# Patient Record
Sex: Male | Born: 1960 | Race: White | Hispanic: No | Marital: Married | Smoking: Former smoker
Health system: Southern US, Community
[De-identification: ages and names within clinical notes are randomized; demographics above are authoritative.]

## PROBLEM LIST (undated history)

## (undated) DIAGNOSIS — E669 Obesity, unspecified: Secondary | ICD-10-CM

## (undated) DIAGNOSIS — Z86010 Personal history of colonic polyps: Secondary | ICD-10-CM

## (undated) DIAGNOSIS — K635 Polyp of colon: Secondary | ICD-10-CM

## (undated) DIAGNOSIS — I10 Essential (primary) hypertension: Secondary | ICD-10-CM

## (undated) DIAGNOSIS — E119 Type 2 diabetes mellitus without complications: Secondary | ICD-10-CM

## (undated) DIAGNOSIS — I639 Cerebral infarction, unspecified: Secondary | ICD-10-CM

## (undated) DIAGNOSIS — K649 Unspecified hemorrhoids: Secondary | ICD-10-CM

## (undated) DIAGNOSIS — I6529 Occlusion and stenosis of unspecified carotid artery: Secondary | ICD-10-CM

## (undated) DIAGNOSIS — I739 Peripheral vascular disease, unspecified: Secondary | ICD-10-CM

## (undated) HISTORY — DX: Obesity, unspecified: E66.9

## (undated) HISTORY — PX: OTHER SURGICAL HISTORY: SHX169

## (undated) HISTORY — DX: Cerebral infarction, unspecified: I63.9

## (undated) HISTORY — PX: BACK SURGERY: SHX140

## (undated) HISTORY — PX: CHOLECYSTECTOMY: SHX55

## (undated) HISTORY — PX: SPINAL FUSION: SHX223

---
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
## 1980-03-17 HISTORY — PX: NECK SURGERY: SHX720

## 1998-09-03 ENCOUNTER — Ambulatory Visit (HOSPITAL_COMMUNITY): Admission: RE | Admit: 1998-09-03 | Discharge: 1998-09-03 | Payer: Self-pay | Admitting: Family Medicine

## 1998-09-04 ENCOUNTER — Ambulatory Visit (HOSPITAL_COMMUNITY): Admission: RE | Admit: 1998-09-04 | Discharge: 1998-09-04 | Payer: Self-pay | Admitting: Family Medicine

## 1998-09-04 ENCOUNTER — Encounter: Payer: Self-pay | Admitting: Family Medicine

## 1999-03-22 ENCOUNTER — Encounter (INDEPENDENT_AMBULATORY_CARE_PROVIDER_SITE_OTHER): Payer: Self-pay | Admitting: *Deleted

## 1999-03-22 ENCOUNTER — Ambulatory Visit (HOSPITAL_BASED_OUTPATIENT_CLINIC_OR_DEPARTMENT_OTHER): Admission: RE | Admit: 1999-03-22 | Discharge: 1999-03-22 | Payer: Self-pay | Admitting: Urology

## 2000-07-17 ENCOUNTER — Encounter: Payer: Self-pay | Admitting: Family Medicine

## 2000-07-17 ENCOUNTER — Ambulatory Visit (HOSPITAL_COMMUNITY): Admission: RE | Admit: 2000-07-17 | Discharge: 2000-07-17 | Payer: Self-pay | Admitting: Family Medicine

## 2000-12-03 ENCOUNTER — Encounter: Admission: RE | Admit: 2000-12-03 | Discharge: 2000-12-03 | Payer: Self-pay | Admitting: Orthopaedic Surgery

## 2000-12-03 ENCOUNTER — Encounter: Payer: Self-pay | Admitting: Orthopaedic Surgery

## 2001-01-13 ENCOUNTER — Encounter: Payer: Self-pay | Admitting: Orthopaedic Surgery

## 2001-01-20 ENCOUNTER — Inpatient Hospital Stay (HOSPITAL_COMMUNITY): Admission: RE | Admit: 2001-01-20 | Discharge: 2001-01-24 | Payer: Self-pay | Admitting: Orthopaedic Surgery

## 2001-01-20 ENCOUNTER — Encounter: Payer: Self-pay | Admitting: Orthopaedic Surgery

## 2001-01-23 ENCOUNTER — Encounter: Payer: Self-pay | Admitting: Orthopaedic Surgery

## 2001-06-21 ENCOUNTER — Ambulatory Visit (HOSPITAL_COMMUNITY): Admission: RE | Admit: 2001-06-21 | Discharge: 2001-06-21 | Payer: Self-pay | Admitting: Orthopaedic Surgery

## 2001-06-21 ENCOUNTER — Encounter: Payer: Self-pay | Admitting: Orthopaedic Surgery

## 2001-07-19 ENCOUNTER — Encounter: Admission: RE | Admit: 2001-07-19 | Discharge: 2001-07-19 | Payer: Self-pay | Admitting: Orthopaedic Surgery

## 2001-07-19 ENCOUNTER — Encounter: Payer: Self-pay | Admitting: Orthopaedic Surgery

## 2002-05-11 ENCOUNTER — Ambulatory Visit (HOSPITAL_COMMUNITY): Admission: RE | Admit: 2002-05-11 | Discharge: 2002-05-11 | Payer: Self-pay | Admitting: Family Medicine

## 2002-05-11 ENCOUNTER — Encounter: Payer: Self-pay | Admitting: Family Medicine

## 2002-06-17 ENCOUNTER — Encounter: Payer: Self-pay | Admitting: Vascular Surgery

## 2002-06-21 ENCOUNTER — Ambulatory Visit (HOSPITAL_COMMUNITY): Admission: RE | Admit: 2002-06-21 | Discharge: 2002-06-21 | Payer: Self-pay | Admitting: Vascular Surgery

## 2003-07-13 ENCOUNTER — Ambulatory Visit (HOSPITAL_COMMUNITY): Admission: RE | Admit: 2003-07-13 | Discharge: 2003-07-13 | Payer: Self-pay | Admitting: Family Medicine

## 2004-08-02 ENCOUNTER — Ambulatory Visit: Payer: Self-pay | Admitting: Cardiology

## 2004-08-05 ENCOUNTER — Ambulatory Visit: Payer: Self-pay | Admitting: Cardiology

## 2004-08-05 ENCOUNTER — Ambulatory Visit (HOSPITAL_COMMUNITY): Admission: RE | Admit: 2004-08-05 | Discharge: 2004-08-05 | Payer: Self-pay | Admitting: Cardiology

## 2004-08-08 ENCOUNTER — Ambulatory Visit: Payer: Self-pay | Admitting: Cardiology

## 2004-08-08 ENCOUNTER — Inpatient Hospital Stay (HOSPITAL_BASED_OUTPATIENT_CLINIC_OR_DEPARTMENT_OTHER): Admission: RE | Admit: 2004-08-08 | Discharge: 2004-08-08 | Payer: Self-pay | Admitting: Cardiology

## 2004-08-30 ENCOUNTER — Ambulatory Visit: Payer: Self-pay | Admitting: Cardiology

## 2007-05-20 ENCOUNTER — Inpatient Hospital Stay (HOSPITAL_COMMUNITY): Admission: AD | Admit: 2007-05-20 | Discharge: 2007-05-23 | Payer: Self-pay | Admitting: General Surgery

## 2007-05-21 ENCOUNTER — Encounter (INDEPENDENT_AMBULATORY_CARE_PROVIDER_SITE_OTHER): Payer: Self-pay | Admitting: General Surgery

## 2008-01-15 ENCOUNTER — Emergency Department (HOSPITAL_COMMUNITY): Admission: EM | Admit: 2008-01-15 | Discharge: 2008-01-15 | Payer: Self-pay | Admitting: Emergency Medicine

## 2008-12-04 DIAGNOSIS — I10 Essential (primary) hypertension: Secondary | ICD-10-CM | POA: Insufficient documentation

## 2008-12-04 DIAGNOSIS — K279 Peptic ulcer, site unspecified, unspecified as acute or chronic, without hemorrhage or perforation: Secondary | ICD-10-CM | POA: Insufficient documentation

## 2008-12-04 DIAGNOSIS — I7090 Unspecified atherosclerosis: Secondary | ICD-10-CM | POA: Insufficient documentation

## 2008-12-06 ENCOUNTER — Ambulatory Visit: Payer: Self-pay | Admitting: Cardiology

## 2008-12-06 DIAGNOSIS — R079 Chest pain, unspecified: Secondary | ICD-10-CM

## 2008-12-06 DIAGNOSIS — R0989 Other specified symptoms and signs involving the circulatory and respiratory systems: Secondary | ICD-10-CM

## 2008-12-06 DIAGNOSIS — R0602 Shortness of breath: Secondary | ICD-10-CM

## 2008-12-06 DIAGNOSIS — R42 Dizziness and giddiness: Secondary | ICD-10-CM

## 2008-12-19 ENCOUNTER — Telehealth (INDEPENDENT_AMBULATORY_CARE_PROVIDER_SITE_OTHER): Payer: Self-pay | Admitting: *Deleted

## 2008-12-20 ENCOUNTER — Ambulatory Visit: Payer: Self-pay | Admitting: Cardiology

## 2008-12-20 ENCOUNTER — Ambulatory Visit: Payer: Self-pay

## 2008-12-20 ENCOUNTER — Encounter (HOSPITAL_COMMUNITY): Admission: RE | Admit: 2008-12-20 | Discharge: 2009-02-16 | Payer: Self-pay | Admitting: Cardiology

## 2008-12-20 ENCOUNTER — Encounter: Payer: Self-pay | Admitting: Cardiology

## 2008-12-28 ENCOUNTER — Telehealth: Payer: Self-pay | Admitting: Cardiology

## 2009-01-11 ENCOUNTER — Encounter: Payer: Self-pay | Admitting: Cardiology

## 2009-01-12 ENCOUNTER — Ambulatory Visit: Payer: Self-pay | Admitting: Cardiology

## 2009-01-12 DIAGNOSIS — F17200 Nicotine dependence, unspecified, uncomplicated: Secondary | ICD-10-CM

## 2009-07-26 ENCOUNTER — Encounter: Payer: Self-pay | Admitting: Cardiology

## 2009-08-01 ENCOUNTER — Ambulatory Visit: Payer: Self-pay

## 2009-08-01 ENCOUNTER — Encounter: Payer: Self-pay | Admitting: Cardiology

## 2009-09-04 ENCOUNTER — Encounter: Payer: Self-pay | Admitting: Cardiology

## 2009-09-06 ENCOUNTER — Ambulatory Visit: Payer: Self-pay | Admitting: Cardiology

## 2009-09-06 DIAGNOSIS — I6529 Occlusion and stenosis of unspecified carotid artery: Secondary | ICD-10-CM

## 2009-09-07 LAB — CONVERTED CEMR LAB
BUN: 22 mg/dL (ref 6–23)
Basophils Absolute: 0.1 10*3/uL (ref 0.0–0.1)
CO2: 26 meq/L (ref 19–32)
Chloride: 108 meq/L (ref 96–112)
Eosinophils Absolute: 0.2 10*3/uL (ref 0.0–0.7)
Glucose, Bld: 105 mg/dL — ABNORMAL HIGH (ref 70–99)
HCT: 34 % — ABNORMAL LOW (ref 39.0–52.0)
INR: 0.9 (ref 0.8–1.0)
MCHC: 34.3 g/dL (ref 30.0–36.0)
MCV: 87.3 fL (ref 78.0–100.0)
Monocytes Relative: 6.5 % (ref 3.0–12.0)
Platelets: 333 10*3/uL (ref 150.0–400.0)
Potassium: 4.8 meq/L (ref 3.5–5.1)
aPTT: 34.2 s — ABNORMAL HIGH (ref 21.7–28.8)

## 2009-09-11 ENCOUNTER — Ambulatory Visit (HOSPITAL_COMMUNITY): Admission: RE | Admit: 2009-09-11 | Discharge: 2009-09-11 | Payer: Self-pay | Admitting: Cardiology

## 2009-09-11 ENCOUNTER — Ambulatory Visit: Payer: Self-pay | Admitting: Cardiology

## 2009-10-19 ENCOUNTER — Ambulatory Visit: Payer: Self-pay | Admitting: Vascular Surgery

## 2009-12-13 ENCOUNTER — Telehealth (INDEPENDENT_AMBULATORY_CARE_PROVIDER_SITE_OTHER): Payer: Self-pay | Admitting: *Deleted

## 2009-12-27 ENCOUNTER — Ambulatory Visit: Payer: Self-pay | Admitting: Vascular Surgery

## 2010-02-12 ENCOUNTER — Ambulatory Visit: Payer: Self-pay | Admitting: Vascular Surgery

## 2010-04-14 LAB — CONVERTED CEMR LAB: Pro B Natriuretic peptide (BNP): 16 pg/mL (ref 0.0–100.0)

## 2010-04-16 NOTE — Cardiovascular Report (Signed)
Summary: Pre-Cath Orders  Pre-Cath Orders   Imported By: Marylou Mccoy 10/01/2009 16:34:34  _____________________________________________________________________  External Attachment:    Type:   Image     Comment:   External Document

## 2010-04-16 NOTE — Miscellaneous (Signed)
  Clinical Lists Changes  Observations: Added new observation of US CAROTID: Stable, moderate, carotid artery disease, bilaterally. Elevated velocities in the LCCA and the left bulb may cause underestimation of LICA stenosis  60-79% bilateral ICA stenosis Severly elevated bilateral ECA velocities.  f/u 6 months (08/01/2009 10:13)      Carotid Doppler  Procedure date:  08/01/2009  Findings:      Stable, moderate, carotid artery disease, bilaterally. Elevated velocities in the LCCA and the left bulb may cause underestimation of LICA stenosis  60-79% bilateral ICA stenosis Severly elevated bilateral ECA velocities.  f/u 6 months
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
## 2010-04-16 NOTE — Letter (Signed)
Summary: Cardiac Catheterization Instructions- Main Lab  [facility] [employer], Main Office  [address]   [place], [place] [address]   Phone: [phone]  Fax: [phone]     09/06/2009 MRN: [id number]  Kurt [name] [address] [place], [place]  [address]  Dear Kurt [name],   You are scheduled for Cardiac Catheterization on Tuesday September 11, 2009 with Dr. Antoine Poche.  Please arrive at the [facility] at 7:30    a.m. on the day of your procedure.  1. DIET     ____ Nothing to eat or drink after midnight except your medications with a sip of water.  2. MAKE SURE YOU TAKE YOUR ASPIRIN.  3. _____ DO NOT TAKE these medications before your procedure:         ____ YOU MAY TAKE ALL of your remaining medications with a small amount of water.  4. Plan for one night stay - bring personal belongings (i.e. toothpaste, toothbrush, etc.)  5. Bring a current list of your medications and current insurance cards.  6. Must have a responsible person to drive you home.   7. Someone must be with yu for the first 24 hours after you arrive home.  8. Please wear clothes that are easy to get on and off and wear slip-on shoes.  *Special note: Every effort is made to have your procedure done on time.  Occasionally there are emergencies that present themselves at the hospital that may cause delays.  Please be patient if a delay does occur.  If you have any questions after you get home, please call the office at the number listed above.  Charolotte Capuchin, RN  Appended Document: Cardiac Catheterization Instructions- Main Lab pt instructed not to take Metformin and only take 1/2 normal dose of insulin the am of his cath.

## 2010-04-16 NOTE — Miscellaneous (Signed)
Summary: Orders Update  Clinical Lists Changes  Orders: Added new Test order of Carotid Duplex (Carotid Duplex) - Signed 

## 2010-04-16 NOTE — Progress Notes (Signed)
  DDS request recieved sent to [facility]  December 13, 2009 8:46 AM

## 2010-06-03 LAB — POCT I-STAT 3, ART BLOOD GAS (G3+)
Acid-base deficit: 6 mmol/L — ABNORMAL HIGH (ref 0.0–2.0)
Bicarbonate: 21.3 meq/L (ref 20.0–24.0)
O2 Saturation: 90 %
TCO2: 23 mmol/L (ref 0–100)
pCO2 arterial: 45.8 mmHg — ABNORMAL HIGH (ref 35.0–45.0)
pH, Arterial: 7.275 — ABNORMAL LOW (ref 7.350–7.450)
pO2, Arterial: 66 mmHg — ABNORMAL LOW (ref 80.0–100.0)

## 2010-06-03 LAB — POCT I-STAT 3, VENOUS BLOOD GAS (G3P V)
Acid-base deficit: 2 mmol/L (ref 0.0–2.0)
Acid-base deficit: 3 mmol/L — ABNORMAL HIGH (ref 0.0–2.0)
Bicarbonate: 23.3 meq/L (ref 20.0–24.0)
O2 Saturation: 58 %
O2 Saturation: 62 %
TCO2: 25 mmol/L (ref 0–100)
TCO2: 27 mmol/L (ref 0–100)
pCO2, Ven: 47.4 mmHg (ref 45.0–50.0)
pH, Ven: 7.3 (ref 7.250–7.300)
pO2, Ven: 34 mmHg (ref 30.0–45.0)
pO2, Ven: 36 mmHg (ref 30.0–45.0)

## 2010-06-03 LAB — GLUCOSE, CAPILLARY: Glucose-Capillary: 126 mg/dL — ABNORMAL HIGH (ref 70–99)
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
## 2010-07-30 NOTE — Consult Note (Signed)
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  He is well-known to me from a prior  common iliac artery stenting by myself in 2004 for severe limiting  claudication symptoms.  He has an unusual symptom complex.  He reports  dizziness and this is related to exercise and lifting his arms.  He has  not had any focal neurologic deficits.  He does have some shortness of  breath with exertion as well.  He reports that if he is walking and  carrying anything that he has become short of breath and also his arms  become fatigued.   PAST MEDICAL HISTORY:  Significant for diabetes on insulin for  approximately 6 years.  He is hypertensive and has elevated cholesterol.   SOCIAL HISTORY:  He is unemployed.  He does have 4 children.  He is  married.  He quit smoking in 2004.  He does not drink alcohol.   FAMILY HISTORY:  Significant for factor Leiden deficiency in his mother.  He has had coronary bypass in his father and brother.   REVIEW OF SYSTEMS:  Multiply positive.  He does have weight gain up to  200 pounds.  He if 5 feet 8 inches tall.  Does have pain with walking.  CARDIAC:  Positive for tightness and shortness breath with exertion.  GI:  Negative.  NEUROLOGIC:  For dizziness and blackouts.  PULMONARY:  Negative.  Hematologic, urinary and ENT are negative.  MUSCULOSKELETAL:  Positive for arthritis, joint pain, and muscle pain.  PSYCHIATRIC:  Negative.  SKIN:  Negative.   PHYSICAL EXAMINATION:  Well developed, well nourished male appearing his  stated age of 62 in no acute distress.  Blood pressure is 169/92 on the right and 174/83 on the left, 91 heart  rate, respirations 18.  HEENT:  Normal.  He does have a harsh left carotid bruit.  No bruit on  the right.  His radial pulses  are 2+.  He has 2+ femoral and 2+ dorsalis pedis  pulses bilaterally.  CHEST:  Clear bilaterally.  HEART:  Regular rate and rhythm.  ABDOMEN:  Soft, moderately obese and nontender.  MUSCULOSKELETAL:  Shows no major deformities or cyanosis.  NEUROLOGIC:  No focal weakness or paresthesias.  SKIN:  Without ulcers or rashes.   He underwent carotid duplex in our office and this reveals moderate 40-  59% stenosis bilaterally in the internal carotids.  He does have a  severe left external carotid and I explained to Kurt [name] and his  wife that this is of no consequence.  I am unable to explain his symptom  complex of dizziness.  He does have antegrade flow in his vertebral  arteries bilaterally.  Of note if I raise his arms above his head, he  does lose his pulses in his radial arteries bilaterally.  I explained  that he may have some component of thoracic outlet syndrome to explain  this.  I am at a loss to explain dizziness, however.  I would recommend  yearly follow-up of his carotids to rule out progressive asymptomatic  carotid disease.  Otherwise I might consider a neurologic evaluation for  further  workup of dizziness.  He will see [place] in a year with a duplex  carotid study.     Larina Earthly, M.D.  Electronically Signed   TFE/MEDQ  D:  10/19/2009  T:  10/22/2009  Job:  [id number]   cc:   Kurt [name], M.D.

## 2010-07-30 NOTE — Op Note (Signed)
Kurt [name], Kurt [name] NO.:  [id number]   MEDICAL RECORD NO.:  [id number]          PATIENT TYPE:  INP   LOCATION:  A307                          FACILITY:  [facility]   PHYSICIAN:  Tilford Pillar, MD      DATE OF BIRTH:  1960-08-07   DATE OF PROCEDURE:  05/21/2007  DATE OF DISCHARGE:                               OPERATIVE REPORT   PREOPERATIVE DIAGNOSIS:  Acute cholecystitis.   POSTOPERATIVE DIAGNOSIS:  Acute cholecystitis.   PROCEDURE:  1. Laparoscopic cholecystectomy.  2. Intra-abdominal drain placement with 10 flat Jackson-Pratt drain.   SURGEON:  Tilford Pillar, M.D.   ANESTHESIA:  General endotracheal, local anesthetic, 1% Sensorcaine  plain.   ESTIMATED BLOOD LOSS:  Less than 100 ml.   SPECIMENS:  Gallbladder.   INDICATIONS:  Patient is a 50 year old male admitted to my service, who  presented with acute onset of right upper quadrant abdominal pain.  This  had been actually going on for approximately five days before his  admission.  He had no similar symptomatology in the past and during his  workup, he was discovered to have acute cholecystitis.  The risks,  benefits and alternatives of a laparoscopic, possible open  cholecystectomy, were discussed at length with the patient.  The  patient's questions and concerns were addressed.  The patient was  consent for the planned procedure after our discussion of the risks,  including but not limited to infection, bleeding, bile leak, small bowel  injury, common bile duct injury, as well as the possibility of  intraoperative pulmonary or cardiac events.   OPERATION:  Patient was taken to the operating room and was placed in a  supine position on the operating room table, at which time the general  anesthetic was administered.  Once the patient was asleep, he was  endotracheally intubated by anesthesia.  At this point, the patient's  abdomen was prepped and draped in the usual fashion.  A supraumbilical  stab  incision was created with an 11 blade scalpel.  Additional  dissection down to the subcuticular tissue was carried out using a  Kocher clamp, which was utilized to grasp the anterior abdominal wall  fascia with this anteriorly.  A Veress needle was inserted.  A saline  drop test was utilized to confirm intraperitoneal placement, and the  pneumoperitoneum was initiated.  Once sufficient pneumoperitoneum was  obtained, an 11 mm trocar was placed over the laparoscope allowing  visualization of the trocar and turning it into the abdominal cavity.  At this point, the inner cannula was removed.  The laparoscope was  reinserted.  No evidence of any trocar or Veress needle placement  injury.  At this time, the remaining trocars were placed with an 11 mm  trocar in the epigastrium, a 5 mm trocar in the midline between the 11  mm trocars, and a 5 mm trocar in the right lateral abdominal wall.  These were all placed in a similar fashion with the stab incision in  place and the trocar under direct visualization.  At this time, the  patient was placed in a reverse  Trendelenburg, left lateral decubitus  position.  The fundus of the gallbladder was lifted up and over the  right lobe of the liver.  Significant adhesions of the omental fat to  the fundus and body of the gallbladder were encountered.  These were  both bluntly sharp as well as dissected with the electrocautery.  Hemostasis was obtained with the electrocautery.  This was carried down  to the infundibulum, again where a significant amount of inflammatory  tissue was encountered.  Continued blunt dissection with a Recruitment consultant, which was seen again anteriorly.  One was created behind the  cystic artery.  Two endoclips were placed proximally, one distally, and  the cystic artery was divided between two cysto clips.  With continued  dissection, the cystic duct was identified.  This was dissected well up  onto the infundibulum to clearly  demonstrate entrance into the  infundibulum, confirming that this was the cystic duct and not the  adhered common bile duct.  As it was, the cystic duct, the one that was  created behind it, three endoclips were placed proximally, one distally,  and the cystic duct was divided between the two most distal clips.  At  this point, electrocautery was utilized to dissect the gallbladder free  from the gallbladder fossa.  As the gallbladder was somewhat  intrahepatic, this further increased the difficulty of the dissection.  Upon freeing the gallbladder, it was placed in an EndoCatch bag, which  was placed up and over the right lobe of the liver.  At this point, the  suction irrigator was brought to the field and was utilized to irrigate  the gallbladder fossa, until the returning aspirate was clear.  Electrocautery was then set to 100-100 on the cut and coagulation.  It  was utilized to obtain hemostasis along the very inflamed gallbladder  fossa of the liver.  At this point, hemostasis was good.  A piece of  Surgicel was placed into the gallbladder fossa, and the patient was  returned to a flat position.  Additional irrigation was utilized, and  the return aspirate over the right lobe of the liver was clear.  At this  point, attention was turned to closure.   Using an Endoclose suture-passing device, a 2-0 Vicryl was passed to  both the 11 mm trocars with the sutures in place.  A 10 flat Blake drain  was positioned.  This was placed through the epigastric trocar with the  tail of the drain being pulled through the right lateral abdominal wall  5 mm trocar site.  The drain was then placed into the gallbladder fossa  near the Endoclips, which were noted to be in excellent position.  The  Surgicel remained adherent to the undersurface of the gallbladder fossa.  At this point, attention was turned to a removal of the gallbladder.  The EndoCatch bag was grasped and was pulled through the  epigastric  trocar site.  A significant amount of blunt and sharp dissection was  required to further enlarge the trocar site, allowing adequate  dilatation to pass the EndoCatch bags, which were placed on the back  table and were sent as a permanent specimen to pathology.  At this  point, the pneumoperitoneum was evacuated.  All trocars were removed.  The sutures were secured.  Skin edges were reapproximated with a 4-0  Monocryl at the three remaining trocar sites.  Local anesthetic was  instilled into all trocar sites as well as the drain site.  The drain  was secured to the skin with a 2-0 nylon suture with all skin incisions  reapproximated.  The skin was washed and dried with a moist-dry towel.  Benzoin was applied around the three midline incisions.  Then 1/2 inch  Steri-Strips were placed over these, and the drapes were removed.  The  patient was allowed to come out of general anesthetic.  He was  transferred back to a regular hospital bed.  He was transferred to the  post anesthesia care unit in stable condition.   At the conclusion of the procedure, all instrument, sponge, and needle  counts were correct.  The patient tolerated the procedure well.      Tilford Pillar, MD  Electronically Signed     BZ/MEDQ  D:  05/21/2007  T:  05/21/2007  Job:  [id number]   cc:   [name], M.D.  Fax: [phone]

## 2010-07-30 NOTE — H&P (Signed)
Kurt [name], Kurt [name] NO.:  [id number]   MEDICAL RECORD NO.:  [id number]          PATIENT TYPE:  INP   LOCATION:  A307                          FACILITY:  [facility]   PHYSICIAN:  Tilford Pillar, MD      DATE OF BIRTH:  08-16-1960   DATE OF ADMISSION:  05/20/2007  DATE OF DISCHARGE:  LH                              HISTORY & PHYSICAL   CHIEF COMPLAINT:  Abdominal pain.   HISTORY OF PRESENT ILLNESS:  Patient is a 50 year old male with a  history of hypertension and atherosclerotic vascular disease who  presents with an approximately 3-4-day history of increasing epigastric  and right upper quadrant abdominal pain.  He has had some radiation of  this around to his back but no other radiation to the shoulders.  He has  had no similar symptoms in the past, although he has had a history of  peptic ulcer disease with abdominal pain, which was different than this.  He has had associated nausea and emesis.  This has been nonbloody,  bilious in nature, over the last several days.  He has also had a couple  of episodes of diarrhea and has noted some blood on the toilet paper  over the last couple of bowel movements, but he has not noticed any  melena or hematochezia.  He denies any dysuria or hematuria.  He denies  any fevers or chills, but does have a family history of biliary disease  and gallbladder disease.   PAST MEDICAL HISTORY:  1. Hypertension.  2. Atherosclerotic vascular disease.  3. Diabetes mellitus type 2, non-insulin-dependent.  4. Peptic ulcer disease.   PAST SURGICAL HISTORY:  1. He has had lower back surgery.  2. Coronary artery angiography.  3. Left common iliac artery angioplasty with stent.   MEDICATIONS:  1. Metformin.  2. Januvia.  3. Ultram.  4. Ibuprofen.  5. Xanax.  6. Lipitor.  7. Flexeril.  8. Lotrel.   ALLERGIES:  No known drug allergies.   HISTORY:  He is a half-pack-per-day smoker.  Occasional alcohol.  No  recreational drug  use.   REVIEW OF SYSTEMS:  No recent weight changes.  No headaches.  EYES:  Unremarkable.  ENT:  Unremarkable.  CARDIOVASCULAR:  Unremarkable.  PULMONARY:  Unremarkable.  GASTROINTESTINAL:  As per HPI.  GENITOURINARY:  He does have occasional difficulty with urination and  voiding, for which he is on medication.  There has been no change from  his baseline.  SKIN:  Unremarkable.  NEURO:  Unremarkable.  MUSCULOSKELETAL:  Unremarkable.  ENDOCRINE:  Unremarkable.   PHYSICAL EXAMINATION:  VITAL SIGNS:  Temperature 97.8, heart rate 101,  respirations 20, blood pressure 124/73.  He is 97% oxygen saturation on  room air.  GENERAL:  He is in no acute distress.  He is alert and oriented x3.  He  is mildly-to-moderately obese, lying supine in a hospital bed.  HEENT:  Pupils are equal, round and reactive.  Extraocular movements are  intact.  No scleral icterus or conjunctival pallor is noted.  His  trachea is midline.  No cervical lymphadenopathy is  apparent.  PULMONARY:  Unlabored respirations.  He is clear to auscultation.  CARDIOVASCULAR:  He is tachycardic but regular rhythm.  ABDOMEN:  He has decreased bowel sounds.  His abdomen is soft and obese.  He does have right upper quadrant tenderness, which is moderate-to-  severe on palpation with a positive Murphy's sign.  He does not have any  diffuse peritoneal signs.  He has no rebound tenderness.  He has no  hernias or masses elicited.  EXTREMITIES:  Warm and dry.   PERTINENT LABORATORY/RADIOGRAPHIC STUDIES:  CBC:  White blood cell count  11.2, hemoglobin 10.9, hematocrit 31.3, platelets 327.  Basic metabolic  panel:  Sodium 129, potassium 4, chloride 104, bicarb 21, BUN 29,  creatinine 1.07, blood glucose 174.  He had a liver panel which was  within normal limits.  His alk phos was 75.  Amylase and lipase were  within normal limits.   Right upper quadrant ultrasound demonstrated no gallstones but some  gallbladder wall thickening and  borderline dilatation of his biliary  tree.   ASSESSMENT/PLAN:  Right upper quadrant abdominal pain:  At this point, I  do have a high suspicion that his gallbladder is the etiology for his  symptomatology; however, with his history of peptic ulcer disease, I do  wish to continue with an acute abdominal series to evaluate for any  possibility of free intra-abdominal air, although my suspicious is very  low based on his clinical presentation.  At this point, I did recommend  continuing an n.p.o. status with limited ice chips and with only limited  sips with his medications this evening.  For diabetes control, I do want  [place] to continue on my insulin sliding scale and to discontinue all  antihypertensive medications at this time.  Additionally, plans are to  start Mefoxin 1 gm IV q.6h. for suspected acute cholecystitis for  antibiotic coverage in light of his diabetes.  Pending the results of  the acute abdominal series, I did advise them that I would recommend  laparoscopic, possible open cholecystectomy, once correction of his  apparent dehydration/electrolyte abnormalities  is corrected.  He does have hyponatremia, which at this point we will  initiate saline infusion.  Patient's questions and concerns are  addressed, and we will continue to monitor the patient closely with  tentative OR within the next 24 hours pending the patient's clinical  course.      Tilford Pillar, MD  Electronically Signed     BZ/MEDQ  D:  05/20/2007  T:  05/20/2007  Job:  [id number]   cc:   [name], M.D.  Fax: [phone]

## 2010-08-02 NOTE — Discharge Summary (Signed)
NAMECARSON, BOGDEN NO.:  [id number]   MEDICAL RECORD NO.:  [id number]          PATIENT TYPE:  INP   LOCATION:  A307                          FACILITY:  [facility]   PHYSICIAN:  Tilford Pillar, MD      DATE OF BIRTH:  Mar 23, 1960   DATE OF ADMISSION:  05/20/2007  DATE OF DISCHARGE:  03/08/2009LH                               DISCHARGE SUMMARY   ADMISSION DIAGNOSIS:  Acute cholecystitis.   DISCHARGE DIAGNOSES:  1. Status post laparoscopic cholecystectomy and intra-abdominal drain      placement.  2. Hypertension.  3. Atherosclerotic vascular disease.  4. Diabetes mellitus type 2, non-insulin dependent.  5. History of peptic ulcer disease.   DISPOSITION:  Home.   HISTORY AND PHYSICAL:  Please see the admission history and physical for  a complete H&P.  The patient is a 50 year old male who presented with  acute onset of right upper quadrant and epigastric abdominal pain.  During his initial evaluation, he was discovered to have signs and  symptoms consistent with acute cholecystitis.  At this point, the  patient was admitted for a planned resuscitation and management of the  acute cholecystitis.   HOSPITAL COURSE:  The patient was admitted on May 20, 2007 and he was  initially started on IV fluid hydration and IV antibiotics.  Risks,  benefits, and alternatives of the laparoscopic cholecystectomy were  discussed with the patient, and the patient was taken to the operating  room on May 21, 2007, for a planned laparoscopic cholecystectomy.  Due  to the inflammatory nature and the resulting pericholecystic  inflammatory process, an intra-abdominal drain was placed.  He tolerated  the procedure well and spent a brief period in the postanesthetic care  unit and was then transferred back to regular hospital floor.  He was  continued to be monitored closely.  He was then slowly advanced on a  diet.  His drain outputs were minimal.  The drain was discontinued  during  his hospital course.  He was ambulatory.  Pain was controlled on  oral pain medication and on May 23, 2007, the patient was discharged to  home.   DISCHARGE INSTRUCTIONS:  1. The patient is to return to work as tolerated.  2. He is to resume an ADA diet.  3. He is to continue wound care with soap and water.  4. He may increase his activities as tolerated and may walk up steps.  5. He is not to lift anything greater than 20 pounds for the next 3      weeks.  6. He may shower, but he is not to soak or bathe for the next 3 weeks.  7. He is not to drive while taking any narcotic pain medication.  8. Followup appointment is with myself in 3 weeks.   DISCHARGE MEDICATIONS:  1. The patient is to continue all previously prescribed home      medications.  2. Percocet 5/325 one to two p.o. q.4 h. p.r.n. pain.      Tilford Pillar, MD  Electronically Signed     BZ/MEDQ  D:  06/16/2007  T:  06/16/2007  Job:  [id number]

## 2010-08-02 NOTE — Discharge Summary (Signed)
Kurt [name]  Patient:    Kurt [name], Kurt [name] Visit Number: [id number] MRN: [id number]          Service Type: SUR Location: 4W 0444 01 Attending Physician:  Patricia Nettle Dictated by:   Ottie Glazier. Wynona Neat, P.A.-C. Admit Date:  01/20/2001 Discharge Date: 01/24/2001                             Discharge Summary  ADMISSION DIAGNOSIS:  History of low-back pain, L5-S1, herniated nucleus pulposus with spinal stenosis.  DISCHARGE DIAGNOSIS:  History of low-back pain, L5-S1, herniated nucleus pulposus with spinal stenosis, improved.  PROCEDURES: 1. Exploration of spinal fusion L5-S1, removal of spinal instrumentation    L5-S1, revision of laminectomy of lateral recess and foraminal    decompression L4-5, L5-S1, pedicle screw instrumentation. 2. Allograft bone graft.  SURGEON:  Patricia Nettle, M.D.  FIRST ASSISTANT:  Javier Docker, M.D. with Ralene Bathe, P.A.-C. also first assisting.  HISTORY OF PRESENT ILLNESS:  The patient is a 50 year old white male with a history of worsening lower back pain and left leg pain.  The patient has a long history of lower back pain and has underwent an L5-S1 diskectomy and fusion in 1993.  He had done quite well, until recently.  While on the job in April 2002, he had an injury to his low back and had a recurrence of symptoms at that time.  The patient was seen and evaluated by Dr. Sharolyn Douglas in [facility].  He was found to have degenerative disk disease L5-S1 with HNP L4-5 and L5-S1, as well as spinal stenosis and back pain.  This was discussed with the patient and the decision was made to proceed with operative intervention.  HOSPITAL COURSE:  The patient was admitted to [facility] and underwent the above procedure, per Dr. Sharolyn Douglas, on January 20, 2001 without complications. Please see operative note for details.  Postop day #1, the patient reported that his back was sore, as expected.  He was  somewhat sleepy.  Bilateral lower extremities showed good sensation and good range of motion.  The following day, the patient reported that he had a "rough" night, complaining of increased low-back pain, as well as heartburn.  Denied any numnbess or tingling of the lower extremities.  His dressing was removed and the incision was clean, dry, and intact without erythema or swelling.  Hemovac was removed without complications.  The patient was able to move bilateral lower extremities well.  Neurovascularly, he was intact.  He was given Robaxin 500 mg IV at that time and continued Percocet and was added Zantac 150 b.i.d. for heartburn.  The following day, the patient states that his pain had improved with the advent of the muscle relaxer.  He was fitted for an LSO brace that day.  He was working well with physical therapy and occupational therapy, still having some moderate pain control issues.  Therefore, he was given morphine 2-4 mg IV q.3h. p.r.n. breakthrough with good resolution and on January 24, 2001 the patient was doing quite well.  Neurovascular lower extremities were intact.  His range of motion was good.  Negative signs for a DVT.  The patient was very eager for discharge.  Therefore, due to his improved condition, the decision was made to discharge him home to the care of his family.  FINAL DIAGNOSIS:  Please see procedure.  CONDITION ON DISCHARGE:  Improved.  DIET:  Regular.  ACTIVITY:  He is to be up ad lib with LSO brace.  May shower on postop day #5. Keep dressing clean, dry, and intact.  No bending or heavy lifting.  DISCHARGE MEDICATIONS: 1. Percocet 5/325 one to two p.o. q.4-6h. p.r.n.; #60 of those. 2. He will be discharged on Valium 10 mg 1 p.o. q.8h. p.r.n. for spasm; #60 of    those, as these worked well for him in an inpatient setting better then    Robaxin. 3. Zantac 150 mg 1 p.o. b.i.d.; #60 of those.  FOLLOWUP:  He will follow up with Dr. Noel Gerold in one  week.  He is to call [phone] for an appointment or questions or concerns. Dictated by:   Ottie Glazier. Wynona Neat, P.A.-C. Attending Physician:  Patricia Nettle. DD:  02/09/01 TD:  02/09/01 Job: [id number]

## 2010-08-02 NOTE — Cardiovascular Report (Signed)
[name], [name] NO.:  [id number]   MEDICAL RECORD NO.:  [id number]          PATIENT TYPE:  OIB   LOCATION:  6501                         FACILITY:  [facility]   PHYSICIAN:  Rollene Rotunda, M.D.   DATE OF BIRTH:  1960-10-09   DATE OF PROCEDURE:  08/08/2004  DATE OF DISCHARGE:                              CARDIAC CATHETERIZATION   PRIMARY CARE PHYSICIAN:  Dr. Margaretmary Bayley.   PROCEDURE:  Left heart catheterization/coronary arteriography.   INDICATION:  Evaluate patient with chest pain, multiple cardiovascular risk  factors and known peripheral vascular disease.   PROCEDURAL NOTE:  Left heart catheterization was performed via the right  femoral artery.  The artery was cannulated using an anterior wall puncture.  A #4-French arterial sheath was inserted via the modified Seldinger  technique.  Preformed Judkins and a pigtail catheter were utilized.  The  patient tolerated the procedure well and left the lab in stable condition.   RESULTS:   HEMODYNAMICS:  LV 132/26, AO 131/67.   CORONARIES:  The left main had a mid 25% stenosis.  The LAD had a proximal  40% stenosis at a large mid-diagonal.  The first diagonal was large and  branching.  The ostial had 25% stenosis.  The circumflex in the AV groove  was normal.  The mid obtuse marginal was large and branching and normal.   The right coronary artery was a large dominant vessel with ostial 30%  stenosis.   LEFT VENTRICULOGRAM:  The left ventriculogram was obtained in the RAO  projection.  The EF was 65% and normal.   CONCLUSION:  Nonobstructive coronary artery disease.   PLAN:  The patient will have aggressive medical management per Dr. Chestine Spore and  Dr. Diona Browner.  At this point, there is no indication for intervention.      JH/MEDQ  D:  08/08/2004  T:  08/08/2004  Job:  [id number]   cc:   Margaretmary Bayley, M.D.  [address]  [place]  [place] [address]  Fax: [phone]   Jonelle Sidle, M.D.  [facility], [place] [facility]

## 2010-08-02 NOTE — Op Note (Signed)
[place]  Patient:    Kurt [name]                      MRN: [id number] Proc. Date: 03/22/99 Adm. Date:  [id number] Attending:  Lindaann Slough                           Operative Report  PREOPERATIVE DIAGNOSIS:  Right spermatocele.  POSTOPERATIVE DIAGNOSIS:  Right spermatocele.  OPERATION:  Right spermatocelectomy.  SURGEON:  Lindaann Slough, M.D.  ANESTHESIA:  General.  INDICATION:  The patient is a 50 year old male who has been complaining of pain and swelling of his right testes.  He was found on physical examination to have a spermatocele.  He is scheduled today for spermatocelectomy.  DESCRIPTION OF PROCEDURE:  Under general anesthesia, the patient was prepped and draped and placed into the supine position.  A longitudinal incision was made on the right scrotum.  The incision was carried down to the tunica vaginalis which was then incised.  The testicles were then delivered through the wound.  There is a minimal amount of fluid along the testicle.  The testicle is normal.  This is no testicular mass.  There is a 1.5 by 1.5 cm firm mass at the head of the epididymis.  The mass was  dissected from the rest of the epididymis and excised.  The mass is cystic in nature and was sent to the lab for permanent section.  The appendix testes was excised.  The edges of the resection of the head of the epididymis were then approximated  with 4-0 Vicryl.  Then the scrotum was closed in two layers with 4-0 Vicryl and  skin was closed with 3-0 Vicryl.  The patient tolerated the procedure well and left the operating room in satisfactory condition to the postanesthesia care unit. DD:  03/22/99 TD:  03/23/99 Job: [id number]

## 2010-08-02 NOTE — Op Note (Signed)
[facility]  Patient:    Kurt [name], Kurt [name] Visit Number: [id number] MRN: [id number]          Service Type: SUR Location: 4W 0444 01 Attending Physician:  Patricia Nettle Dictated by:   Patricia Nettle, M.D. Proc. Date: 01/20/01 Admit Date:  01/20/2001                             Operative Report  DATE OF BIRTH:  06-Oct-1960  DIAGNOSES: 1. Pseudoarthrosis L5-S1. 2. Degenerative disc disease L5-S1. 3. Spinal stenosis L4-5 and L5-S1. 4. Disabling low back pain and left greater than right leg pain. 5. Nicotine dependence.  OPERATION: 1. Exploration of spinal fusion L5-S1. 2. Removal of spinal instrumentation L5-S1. 3. Revision laminectomy with lateral recess and foraminal decompression    L4-5 and L5-S1. 4. Posterior lateral fusion L4 to the sacrum. 5. Segmental pedicle screw instrumentation L4 to the sacrum using the    DePuy Acromed Monarch pedicle screw system. 6. Local auto bone graft. 7. Allograft using right medical AlloMatrix.  SURGEON:  Patricia Nettle, M.D.  ASSISTANT:  Kurt [name], M.D./[facility]  ESTIMATED BLOOD LOSS:  700 cc  COMPLICATIONS:  None.  INDICATIONS:  The patient is a 50 year old male who is status post AL5 diskectomy and instrument fusion L5-S1 in 1993.  He had done well for several years until a large concrete bucket struck him in the back while at work.  His complaints are of that of low back pain with bilateral lower extremity radiation left greater than right.  Plain radiographs showed collapse of the L5-S1 disc space with retrolisthesis of L4 on 5 and foraminal narrowing. There was a radiolucent line about the left L5-S1 facet joint where a transfacet screw had been previously placed. MRI showed lateral recess stenosis at L4-5 and L5-S1 and foraminal stenosis at the L5-S1 level bilaterally.  CT myelogram again showed lateral recess stenosis at L4-5 and L5-S1, left greater than  right and a pseudoarthrosis on the left at the L5-S1 facet fusion.  The patient had failed conservative treatment including physical therapy and epidural steroid injections as well as medication.  He was counselled to stop smoking and understands the risk of nonunion with nicotine abuse and spinal fusion.  DESCRIPTION OF PROCEDURE:  The patient was properly identified in the holding area as Kurt [name] and taken to the operating room.  He was given prophylactic antibiotics intravenously.  He underwent general endotracheal anesthesia without difficulty.  He was placed prone onto the Acromed four poster frame.  Care was taken to pad all bony prominences.  The back was prepped and draped in the usual sterile fashion.  A midline incision was carried through the old scar and extended approximately 3 cm.  Dissection was carried down to the fascia.  The fascia was incised and the paraspinal muscles stripped out to the tip of the transverse processes L4, 5 and the sacral ala. Extensive scar tissue was encountered.  There had been previous resection of the spinous processes of L4, 5 and S1.  There had been a previous laminotomy performed at L5-S1 on the left.  There was motion at the L5-S1 joint on the left.  On the right, there appears to have been a solid fusion.  A revision decompression was done by first removing the L4 lamina centrally and carrying this down through the previous scar tissue with care taken to avoid  a dural laceration.  After a central decompression was performed, L4 to the sacrum, the lateral recess and foramen were decompressed at L4-5 and L5-S1 bilaterally.  A probe was passed out the foramen to confirm patency.  The decompression was carried laterally out to the pedicles.  The transverse processes were decorticated as well as the lateral border of the pars interarticularis and the lateral border of the superior facets.  The sacral ala was decorticated.  Using bony  landmarks, 6.25 mm pedicle screws were placed at L4 and 5 bilaterally using a pedicle probing technique.  First the small followed by the pedicle probe and then the ball tip feeler and then tapping.  This was carried out bilaterally.  At the S1 level fluoroscopy was utilized because of the distorted anatomy.  Then 7 mm screws were placed again using a pedicle probing technique and fluoroscopy to confirm adequate positioning.  After all the screws were placed, AP and lateral fluoroscopy was utilized to confirm acceptable positioning.  The pedicles were probed from within the canal as well to confirm that there was no breech.  At this appropriately contoured lordotic rods were placed into the pedicle screw polyaxial heads and locked with the appropriate caps.  Gentle compression was placed across the screws.  The local bone graft that was collected from the lamina and spinous processes were packed into the lateral gutter from transverse process to transverse process and then to the ala in copious amounts.  AlloMatrix Allograft chips were then packed on top of this.  Care was taken to avoid any bone graft material falling into the canal.  The wound was irrigated.  Fresh Gelfoam was placed over the laminectomy defect.  It should be noted that the transfacet screws at L5-S1 were removed with the appropriate screwdriver before placing the pedicle screws.  Deep Hemovac drain was placed.  The fascia was closed using a running #1 Vicryl suture.  The subcutaneous layers were approximated using 2-0 Vicryl interrupted suture. Staples were used to approximate the skin.  The patient was turned supine and extubated without difficulty.  He was moving his lower extremities and transferred to the recovery room in stable condition. Dictated by:   Patricia Nettle, M.D. Attending Physician:  Patricia Nettle. DD:  01/20/01 TD:  01/22/01 Job: [id number]
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
## 2010-08-02 NOTE — Op Note (Signed)
NAME:  BARTT, GONZAGA NO.:  [id number]   MEDICAL RECORD NO.:  [id number]                   PATIENT TYPE:  OIB   LOCATION:  2899                                 FACILITY:  [facility]   PHYSICIAN:  Larina Earthly, M.D.                 DATE OF BIRTH:  1960-03-31   DATE OF PROCEDURE:  06/21/2002  DATE OF DISCHARGE:                                 OPERATIVE REPORT   PREOPERATIVE DIAGNOSIS:  Left leg arterial insufficiency.   POSTOPERATIVE DIAGNOSIS:  Left leg arterial insufficiency.   PROCEDURES:  1. Aortogram with bilateral lower extremity runoff.  2. Left common iliac artery balloon angioplasty and Palmaz stent placement.   SURGEON:  Larina Earthly, M.D.   ANESTHESIA:  1% lidocaine local.   COMPLICATIONS:  None.   DISPOSITION:  To holding area stable.   DESCRIPTION OF PROCEDURE:  The patient was taken to the peripheral vascular  catheterization lab, placed in the supine position, where the area of both  groins was prepped and draped in the usual sterile fashion.  The patient did  not have a femoral pulse on the left.  The right common femoral artery was  accessed using local anesthesia and then using the Seldinger technique, the  guidewire was passed up to the level of the suprarenal aorta and a 5 French  sheath was passed over the guidewire.  The pigtail catheter was then  positioned at the level of the suprarenal aorta.  The patient had prior  instrumentation for lumbar back disease.  Oblique films were also taken for  better visualization around the hardware.  AP projection revealed widely  patent renal arteries bilaterally.  The patient had a single left and two  right renal arteries.  The aorta itself was without evidence of stenosis.  The right iliac system was widely patent with normal flow into the internal  iliac and external iliac arteries on the right.  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  The external iliac artery was smaller due to  hypoperfusion.  Runoff was obtained, and this revealed widely patent  profunda and superficial femoral arteries bilaterally.  The patient had  widely patent popliteal arteries with no evidence of lower extremity  arterial insufficiency.  The patient had normal three-vessel runoff  bilaterally.  Next using a hand-held pencil probe Doppler, the left common  femoral artery was easily entered and using a Seldinger technique, a Wholey  wire was passed across the stenosis with no difficulty.  A long 6 French  sheath was positioned over the wire.  The patient was given 5000 units of  intravenous heparin.  The sheath was passed through the stenosis.  A pre-  mounted Palmaz stent was placed on a 7 mm balloon with a 24 mm stent.  This  was positioned at  the level of the stenosis just inside the common iliac  artery, away from the aortic bifurcation.  A pigtail injection was taken  through the pigtail and placed through the right groin approach to assure  good placement of the stent.  This was felt to be in good positioning, and  the stent was deployed in the common iliac artery just inside the common  iliac artery below the bifurcation.  Retrograde injection through the sheath  showed good positioning of the stent with no evidence of residual stenosis.  Final completion arteriogram through the pigtail catheter in the aorta again  showed excellent flow with no evidence of impingement on the right common  iliac artery system and excellent flow in the left iliac system.  The  patient tolerated the procedure without complications, was transferred to  the holding area in stable condition.   FINDINGS:  1. 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  2. Normal right iliac system and normal bilateral external and infrainguinal     arterial flow.  3. Successful  balloon dilatation and stent placement in the left common     iliac artery with a Palmaz balloon-expandable 24 mm stent on a 7 mm     balloon.                                               Larina Earthly, M.D.    TFE/MEDQ  D:  06/21/2002  T:  06/21/2002  Job:  [id number]

## 2010-10-15 ENCOUNTER — Ambulatory Visit: Payer: Self-pay | Admitting: Vascular Surgery

## 2010-10-15 ENCOUNTER — Other Ambulatory Visit: Payer: Self-pay

## 2010-12-09 LAB — DIFFERENTIAL
Basophils Absolute: 0
Basophils Absolute: 0
Basophils Relative: 0
Eosinophils Absolute: 0
Lymphs Abs: 2
Monocytes Absolute: 1
Monocytes Absolute: 1.4 — ABNORMAL HIGH
Monocytes Relative: 12
Monocytes Relative: 13 — ABNORMAL HIGH
Neutro Abs: 7.5
Neutrophils Relative %: 62

## 2010-12-09 LAB — COMPREHENSIVE METABOLIC PANEL
ALT: 37
AST: 27
Alkaline Phosphatase: 75
CO2: 21
Calcium: 8.4
GFR calc Af Amer: >60
Sodium: 129 — ABNORMAL LOW
Total Protein: 6.4

## 2010-12-09 LAB — BASIC METABOLIC PANEL
BUN: 12
CO2: 25
Calcium: 8.1 — ABNORMAL LOW
Chloride: 103
Creatinine, Ser: 0.84
GFR calc Af Amer: >60
GFR calc non Af Amer: >60
Glucose, Bld: 177 — ABNORMAL HIGH

## 2010-12-09 LAB — CBC
HCT: 28.1 — ABNORMAL LOW
HCT: 31.3 — ABNORMAL LOW
Hemoglobin: 10.9 — ABNORMAL LOW
MCV: 84.8
Platelets: 322
RBC: 3.32 — ABNORMAL LOW
RDW: 13.4

## 2010-12-09 LAB — LIPASE, BLOOD: Lipase: 41

## 2010-12-09 LAB — MAGNESIUM: Magnesium: 2

## 2010-12-09 LAB — BILIRUBIN, DIRECT: Bilirubin, Direct: <0.1

## 2010-12-09 LAB — PROTIME-INR
INR: 0.9
Prothrombin Time: 12.7

## 2010-12-09 LAB — URINALYSIS, ROUTINE W REFLEX MICROSCOPIC
Bilirubin Urine: NEGATIVE
Glucose, UA: NEGATIVE
Hgb urine dipstick: NEGATIVE
Ketones, ur: NEGATIVE
Protein, ur: NEGATIVE
Urobilinogen, UA: 0.2

## 2010-12-09 LAB — PHOSPHORUS: Phosphorus: 2.5

## 2011-10-23 ENCOUNTER — Encounter: Payer: Self-pay | Admitting: Vascular Surgery

## 2012-03-09 ENCOUNTER — Ambulatory Visit (HOSPITAL_COMMUNITY)
Admission: RE | Admit: 2012-03-09 | Discharge: 2012-03-09 | Disposition: A | Discharge status: Home / Self Care | Payer: PRIVATE HEALTH INSURANCE | Source: Ambulatory Visit | Attending: Family Medicine | Admitting: Family Medicine

## 2012-03-09 ENCOUNTER — Other Ambulatory Visit (HOSPITAL_COMMUNITY): Payer: Self-pay | Admitting: Family Medicine

## 2012-03-09 DIAGNOSIS — J189 Pneumonia, unspecified organism: Secondary | ICD-10-CM | POA: Insufficient documentation

## 2014-02-17 ENCOUNTER — Other Ambulatory Visit: Payer: Self-pay | Admitting: Orthopaedic Surgery

## 2014-02-17 DIAGNOSIS — M545 Low back pain: Secondary | ICD-10-CM

## 2014-02-23 ENCOUNTER — Ambulatory Visit
Admission: RE | Admit: 2014-02-23 | Discharge: 2014-02-23 | Disposition: A | Discharge status: Home / Self Care | Payer: Worker's Compensation | Source: Ambulatory Visit | Attending: Orthopaedic Surgery | Admitting: Orthopaedic Surgery

## 2014-02-23 DIAGNOSIS — M545 Low back pain: Secondary | ICD-10-CM

## 2014-02-23 MED ORDER — GADOBENATE DIMEGLUMINE 529 MG/ML IV SOLN
18.0000 mL | Freq: Once | INTRAVENOUS | Status: AC | PRN
  Administered 2014-02-23: 18 mL via INTRAVENOUS

## 2014-03-17 HISTORY — PX: BACK SURGERY: SHX140

## 2016-03-27 DIAGNOSIS — J209 Acute bronchitis, unspecified: Secondary | ICD-10-CM | POA: Diagnosis not present

## 2016-03-27 DIAGNOSIS — J040 Acute laryngitis: Secondary | ICD-10-CM | POA: Diagnosis not present

## 2016-03-27 DIAGNOSIS — J343 Hypertrophy of nasal turbinates: Secondary | ICD-10-CM | POA: Diagnosis not present

## 2016-03-27 DIAGNOSIS — I10 Essential (primary) hypertension: Secondary | ICD-10-CM | POA: Diagnosis not present

## 2016-05-05 DIAGNOSIS — G894 Chronic pain syndrome: Secondary | ICD-10-CM | POA: Diagnosis not present

## 2016-05-05 DIAGNOSIS — M4326 Fusion of spine, lumbar region: Secondary | ICD-10-CM | POA: Diagnosis not present

## 2016-05-05 DIAGNOSIS — M545 Low back pain: Secondary | ICD-10-CM | POA: Diagnosis not present

## 2016-06-20 ENCOUNTER — Emergency Department (HOSPITAL_COMMUNITY): Payer: 59

## 2016-06-20 ENCOUNTER — Emergency Department (HOSPITAL_COMMUNITY)
Admission: EM | Admit: 2016-06-20 | Discharge: 2016-06-20 | Disposition: A | Discharge status: Home / Self Care | Payer: 59 | Attending: Emergency Medicine | Admitting: Emergency Medicine

## 2016-06-20 DIAGNOSIS — I10 Essential (primary) hypertension: Secondary | ICD-10-CM | POA: Diagnosis not present

## 2016-06-20 DIAGNOSIS — Y929 Unspecified place or not applicable: Secondary | ICD-10-CM | POA: Insufficient documentation

## 2016-06-20 DIAGNOSIS — S63502A Unspecified sprain of left wrist, initial encounter: Secondary | ICD-10-CM | POA: Diagnosis not present

## 2016-06-20 DIAGNOSIS — W010XXA Fall on same level from slipping, tripping and stumbling without subsequent striking against object, initial encounter: Secondary | ICD-10-CM | POA: Diagnosis not present

## 2016-06-20 DIAGNOSIS — Y9389 Activity, other specified: Secondary | ICD-10-CM | POA: Diagnosis not present

## 2016-06-20 DIAGNOSIS — Y999 Unspecified external cause status: Secondary | ICD-10-CM | POA: Diagnosis not present

## 2016-06-20 DIAGNOSIS — M25531 Pain in right wrist: Secondary | ICD-10-CM | POA: Diagnosis not present

## 2016-06-20 DIAGNOSIS — S6992XA Unspecified injury of left wrist, hand and finger(s), initial encounter: Secondary | ICD-10-CM | POA: Diagnosis not present

## 2016-06-20 MED ORDER — HYDROCODONE-ACETAMINOPHEN 5-325 MG PO TABS
1.0000 | ORAL_TABLET | Freq: Once | ORAL | Status: AC
  Administered 2016-06-20: 1 via ORAL
  Filled 2016-06-20: qty 1

## 2016-06-20 MED ORDER — TRAMADOL HCL 50 MG PO TABS: 50.0000 mg | ORAL_TABLET | Freq: Four times a day (QID) | ORAL | 0 refills | Status: DC | PRN

## 2016-06-20 NOTE — Discharge Instructions (Signed)
Wear the brace as needed. Elevate and apply ice packs on and off to your wrist. Call the hand specialist listed to arrange a follow-up appointment in one week if not improving.  Please take your blood pressure medication when you return home and take your medications daily as directed

## 2016-06-20 NOTE — ED Notes (Addendum)
Pt reports  No HTN meds for 3 days- (? Amlodipine, metoprolol)  states brok his wrist several weeks ago, but reinjured last night and has had no relief with ibuprofen

## 2016-06-20 NOTE — ED Triage Notes (Signed)
Pt reports that that he fell 2 weeks ago and injured left wrist. Then fell a second tiime several days ago and hurt wrist again. Reports pain with movement.

## 2016-06-20 NOTE — ED Provider Notes (Signed)
[facility] DEPT Provider Note   CSN: [id number] Arrival date & time: 06/20/16  1421     History   Chief Complaint Chief Complaint  Patient presents with  . Wrist Pain    HPI Kurt [name] is a 56 y.o. male.  HPI   Kurt [name] is a 56 y.o. male who presents to the Emergency Department complaining of left wrist pain for two weeks.  He states that he slipped and fell two weeks ago landing on his left hand and elbow and re injured the same wrist several days ago while walking his dog and the dog "jerked" the leash.  He reports worsening pain to the lateral wrist with movement, resolves at rest.  Pain does not radiate into his arm or elbow.  He denies skin changes, numbness or the hand or fingers, or extremity weakness.   No past medical history on file.  Patient Active Problem List   Diagnosis Date Noted  . CAROTID ARTERY STENOSIS 09/06/2009  . TOBACCO USER 01/12/2009  . DIZZINESS 12/06/2008  . CAROTID BRUIT 12/06/2008  . DYSPNEA 12/06/2008  . CHEST PAIN UNSPECIFIED 12/06/2008  . HYPERTENSION 12/04/2008  . ATHEROSCLEROTIC VASCULAR DISEASE 12/04/2008  . PEPTIC ULCER DISEASE 12/04/2008    No past surgical history on file.     Home Medications    Prior to Admission medications   Medication Sig Start Date End Date Taking? Authorizing Provider  atorvastatin (LIPITOR) 40 MG tablet Take 40 mg by mouth at bedtime.  04/12/16  Yes Historical Provider, MD  cyclobenzaprine (FLEXERIL) 10 MG tablet Take 10 mg by mouth 3 (three) times daily as needed for muscle spasms.  06/14/16  Yes Historical Provider, MD  doxazosin (CARDURA) 4 MG tablet Take 4 mg by mouth every evening.  06/14/16  Yes Historical Provider, MD  gabapentin (NEURONTIN) 300 MG capsule Take 600-900 mg by mouth 3 (three) times daily.  05/05/16  Yes Historical Provider, MD  glimepiride (AMARYL) 4 MG tablet Take 4 mg by mouth 2 (two) times daily. 05/27/16  Yes Historical Provider, MD  IBU 800 MG tablet Take 800 mg by  mouth every 8 (eight) hours as needed for mild pain or moderate pain.  05/05/16  Yes Historical Provider, MD  Insulin Glargine (BASAGLAR KWIKPEN) 100 UNIT/ML SOPN Inject 45 Units into the skin every evening.  05/23/16  Yes Historical Provider, MD  JENTADUETO 2.07-998 MG TABS Take 1 tablet by mouth 2 (two) times daily. 05/26/16  Yes Historical Provider, MD  nitroGLYCERIN (NITROSTAT) 0.4 MG SL tablet Place 0.4 mg under the tongue. 04/30/16  Yes Historical Provider, MD  pentoxifylline (TRENTAL) 400 MG CR tablet Take 400 mg by mouth 3 (three) times daily. 05/27/16  Yes Historical Provider, MD  PROAIR HFA 108 (90 Base) MCG/ACT inhaler Inhale 1-2 puffs into the lungs every 6 (six) hours as needed for wheezing or shortness of breath.  03/27/16  Yes Historical Provider, MD  tiZANidine (ZANAFLEX) 4 MG tablet Take 4 mg by mouth every 8 (eight) hours as needed for muscle spasms.  06/04/16  Yes Historical Provider, MD  traMADol (ULTRAM) 50 MG tablet Take 1 tablet (50 mg total) by mouth every 6 (six) hours as needed. 06/20/16   Burnis Kaser, PA-C    Family History No family history on file.  Social History Social History  Substance Use Topics  . Smoking status: Not on file  . Smokeless tobacco: Not on file  . Alcohol use Not on file     Allergies  Patient has no known allergies.   Review of Systems Review of Systems  Constitutional: Negative for chills and fever.  Musculoskeletal: Positive for arthralgias (left wrist pain) and joint swelling. Negative for neck pain.  Skin: Negative for color change and wound.  Neurological: Negative for weakness and numbness.  All other systems reviewed and are negative.    Physical Exam Updated Vital Signs BP (!) 185/91 (BP Location: Right Arm)   Pulse 98   Temp 97.3 F (36.3 C) (Oral)   Resp 18   Ht 5\' 8"  (1.727 m)   Wt 86.2 kg   SpO2 97%   BMI 28.89 kg/m   Physical Exam  Constitutional: He is oriented to person, place, and time. He appears  well-developed and well-nourished. No distress.  HENT:  Head: Normocephalic and atraumatic.  Cardiovascular: Normal rate, regular rhythm and intact distal pulses.   Pulmonary/Chest: Effort normal and breath sounds normal.  Musculoskeletal: He exhibits edema and tenderness. He exhibits no deformity.  ttp of the distal wrist.  Mild edema.  Pain reproduced on ROM. No bony deformity.  No proximal tenderness.  Neurological: He is alert and oriented to person, place, and time. He exhibits normal muscle tone. Coordination normal.  Skin: Skin is warm and dry. Capillary refill takes less than 2 seconds. No rash noted. No erythema.  Nursing note and vitals reviewed.    ED Treatments / Results  Labs (all labs ordered are listed, but only abnormal results are displayed) Labs Reviewed - No data to display  EKG  EKG Interpretation None       Radiology Dg Wrist Complete Left  Result Date: 06/20/2016 CLINICAL DATA:  Fall 4 weeks ago with subsequent fall 2 weeks ago and right wrist pain. EXAM: LEFT WRIST - COMPLETE 3+ VIEW COMPARISON:  None. FINDINGS: Mild degenerative change over the distal radioulnar joint. No evidence of acute fracture or dislocation. IMPRESSION: No acute findings. Electronically Signed   By: Marin Olp M.D.   On: 06/20/2016 15:00    Procedures Procedures (including critical care time)  Medications Ordered in ED Medications  HYDROcodone-acetaminophen (NORCO/VICODIN) 5-325 MG per tablet 1 tablet (1 tablet Oral Given 06/20/16 1542)     Initial Impression / Assessment and Plan / ED Course  I have reviewed the triage vital signs and the nursing notes.  Pertinent labs & imaging results that were available during my care of the patient were reviewed by me and considered in my medical decision making (see chart for details).    XR neg for fx, NV intact.  Likely sprain.  velcro wrist splint applied by nursing.    Pt states he has not taken his BP medications in 3 days.  He  was hypertensive and tachycardic on arrival, but denies sx's other than wrist pain.  On recheck, pt feeling better after po fluids and pain medication.  Vitals improved.  Pt agrees to take his BP meds when he returns home, RICE therapy for wrist, referral given for orthopedics if pain not improving.   Final Clinical Impressions(s) / ED Diagnoses   Final diagnoses:  Sprain of left wrist, initial encounter  Essential hypertension    New Prescriptions Discharge Medication List as of 06/20/2016  4:20 PM    START taking these medications   Details  traMADol (ULTRAM) 50 MG tablet Take 1 tablet (50 mg total) by mouth every 6 (six) hours as needed., Starting Fri 06/20/2016, Print         [employer], PA-C 06/20/16 2135  Merrily Pew, MD 06/20/16 2139

## 2016-06-20 NOTE — ED Notes (Signed)
Pt Education: HTN, DM and renal disease as well as MI and CVA- stressed the import of having pt follow with physician regarding his meds

## 2016-06-20 NOTE — ED Notes (Signed)
Application of wrist splint

## 2016-06-20 NOTE — ED Notes (Signed)
Pt to fast track with explanation of no home meds for HTN today as well as nervous from hospital for tachy

## 2017-01-07 DIAGNOSIS — Z1389 Encounter for screening for other disorder: Secondary | ICD-10-CM | POA: Diagnosis not present

## 2017-01-07 DIAGNOSIS — Z23 Encounter for immunization: Secondary | ICD-10-CM | POA: Diagnosis not present

## 2017-01-07 DIAGNOSIS — E1165 Type 2 diabetes mellitus with hyperglycemia: Secondary | ICD-10-CM | POA: Diagnosis not present

## 2017-01-07 DIAGNOSIS — E782 Mixed hyperlipidemia: Secondary | ICD-10-CM | POA: Diagnosis not present

## 2017-03-06 ENCOUNTER — Encounter (HOSPITAL_COMMUNITY): Payer: Self-pay | Admitting: Emergency Medicine

## 2017-03-06 ENCOUNTER — Emergency Department (HOSPITAL_COMMUNITY): Payer: 59

## 2017-03-06 ENCOUNTER — Emergency Department (HOSPITAL_COMMUNITY)
Admission: EM | Admit: 2017-03-06 | Discharge: 2017-03-06 | Disposition: A | Discharge status: Home / Self Care | Payer: 59 | Attending: Emergency Medicine | Admitting: Emergency Medicine

## 2017-03-06 ENCOUNTER — Other Ambulatory Visit: Payer: Self-pay

## 2017-03-06 DIAGNOSIS — Y999 Unspecified external cause status: Secondary | ICD-10-CM | POA: Diagnosis not present

## 2017-03-06 DIAGNOSIS — Y939 Activity, unspecified: Secondary | ICD-10-CM | POA: Diagnosis not present

## 2017-03-06 DIAGNOSIS — I10 Essential (primary) hypertension: Secondary | ICD-10-CM | POA: Diagnosis not present

## 2017-03-06 DIAGNOSIS — Y92009 Unspecified place in unspecified non-institutional (private) residence as the place of occurrence of the external cause: Secondary | ICD-10-CM | POA: Diagnosis not present

## 2017-03-06 DIAGNOSIS — Z87891 Personal history of nicotine dependence: Secondary | ICD-10-CM | POA: Diagnosis not present

## 2017-03-06 DIAGNOSIS — S93402A Sprain of unspecified ligament of left ankle, initial encounter: Secondary | ICD-10-CM | POA: Diagnosis not present

## 2017-03-06 DIAGNOSIS — S99912A Unspecified injury of left ankle, initial encounter: Secondary | ICD-10-CM | POA: Diagnosis not present

## 2017-03-06 DIAGNOSIS — Z79899 Other long term (current) drug therapy: Secondary | ICD-10-CM | POA: Diagnosis not present

## 2017-03-06 DIAGNOSIS — M25572 Pain in left ankle and joints of left foot: Secondary | ICD-10-CM | POA: Diagnosis not present

## 2017-03-06 DIAGNOSIS — E119 Type 2 diabetes mellitus without complications: Secondary | ICD-10-CM | POA: Diagnosis not present

## 2017-03-06 DIAGNOSIS — W010XXA Fall on same level from slipping, tripping and stumbling without subsequent striking against object, initial encounter: Secondary | ICD-10-CM | POA: Diagnosis not present

## 2017-03-06 HISTORY — DX: Essential (primary) hypertension: I10

## 2017-03-06 HISTORY — DX: Type 2 diabetes mellitus without complications: E11.9

## 2017-03-06 MED ORDER — HYDROCODONE-ACETAMINOPHEN 5-325 MG PO TABS
1.0000 | ORAL_TABLET | Freq: Once | ORAL | Status: AC
Start: 2017-03-06 — End: 2017-03-06
  Administered 2017-03-06: 1 via ORAL
  Filled 2017-03-06: qty 1

## 2017-03-06 MED ORDER — HYDROCODONE-ACETAMINOPHEN 5-325 MG PO TABS: 1.0000 | ORAL_TABLET | ORAL | 0 refills | Status: DC | PRN

## 2017-03-06 NOTE — Discharge Instructions (Signed)
Wear the ASO and use your crutches to avoid weight bearing.  Use ice and elevation as much as possible for the next several days to help reduce the swelling.  Take the medications prescribed.  You may take the hydrocodone prescribed for pain relief.  This will make you drowsy - do not drive within 4 hours of taking this medication.   Call the orthopedic doctor listed for a recheck of your injury in 1 week, especially if you are not starting to get improvement by this time.  Remember though that it may take up to a month for an ankle sprain to really heal.  You may benefit from physical therapy of your ankle if it is not getting better over the next week.
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
## 2017-03-06 NOTE — ED Provider Notes (Signed)
[facility] EMERGENCY DEPARTMENT Provider Note   CSN: [id number] Arrival date & time: 03/06/17  1619     History   Chief Complaint Chief Complaint  Patient presents with  . Foot Pain  . Ankle Pain    HPI Kurt [name] is a 56 y.o. 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  He slipped on a Christmas train track that was set up on his hardwood floor, describing direct fall and inversion injury to the left ankle and foot.  He has had persistent pain and swelling despite elevation, ice and ibuprofen.  He also has crutches and has been using them to try to minimize weightbearing.  Today his symptoms worsened, describing a constant throbbing sensation at the ankle despite the above treatments.  He denies head injury.  His blood pressure is elevated today at 187/102.  He reports taking his blood pressure medications today as directed.  He suspects his blood pressure is elevated secondary to pain.  He denies headache, blurred vision, chest pain or shortness of breath.  The history is provided by the patient and the spouse.    Past Medical History:  Diagnosis Date  . Diabetes mellitus without complication ([place])   . Hypertension     Patient Active Problem List   Diagnosis Date Noted  . CAROTID ARTERY STENOSIS 09/06/2009  . TOBACCO USER 01/12/2009  . DIZZINESS 12/06/2008  . CAROTID BRUIT 12/06/2008  . DYSPNEA 12/06/2008  . CHEST PAIN UNSPECIFIED 12/06/2008  . HYPERTENSION 12/04/2008  . ATHEROSCLEROTIC VASCULAR DISEASE 12/04/2008  . PEPTIC ULCER DISEASE 12/04/2008    Past Surgical History:  Procedure Laterality Date  . BACK SURGERY    . CHOLECYSTECTOMY         Home Medications    Prior to Admission medications   Medication Sig Start Date End Date Taking? Authorizing Provider  atorvastatin (LIPITOR) 40 MG tablet Take 40 mg by mouth  at bedtime.  04/12/16   [provider]  cyclobenzaprine (FLEXERIL) 10 MG tablet Take 10 mg by mouth 3 (three) times daily as needed for muscle spasms.  06/14/16   [provider]  doxazosin (CARDURA) 4 MG tablet Take 4 mg by mouth every evening.  06/14/16   [provider]  gabapentin (NEURONTIN) 300 MG capsule Take 600-900 mg by mouth 3 (three) times daily.  05/05/16   [provider]  glimepiride (AMARYL) 4 MG tablet Take 4 mg by mouth 2 (two) times daily. 05/27/16   [provider]  HYDROcodone-acetaminophen (NORCO/VICODIN) 5-325 MG tablet Take 1 tablet by mouth every 4 (four) hours as needed for moderate pain. 03/06/17   Evalee Jefferson, PA-C  IBU 800 MG tablet Take 800 mg by mouth every 8 (eight) hours as needed for mild pain or moderate pain.  05/05/16   [provider]  Insulin Glargine (BASAGLAR KWIKPEN) 100 UNIT/ML SOPN Inject 45 Units into the skin every evening.  05/23/16   [provider]  JENTADUETO 2.07-998 MG TABS Take 1 tablet by mouth 2 (two) times daily. 05/26/16   [provider]  nitroGLYCERIN (NITROSTAT) 0.4 MG SL tablet Place 0.4 mg under the tongue. 04/30/16   [provider]  pentoxifylline (TRENTAL) 400 MG CR tablet Take 400 mg by mouth 3 (three) times daily. 05/27/16   [provider]  PROAIR HFA 108 [phone] Base) MCG/ACT inhaler  Inhale 1-2 puffs into the lungs every 6 (six) hours as needed for wheezing or shortness of breath.  03/27/16   [provider]  tiZANidine (ZANAFLEX) 4 MG tablet Take 4 mg by mouth every 8 (eight) hours as needed for muscle spasms.  06/04/16   [provider]  traMADol (ULTRAM) 50 MG tablet Take 1 tablet (50 mg total) by mouth every 6 (six) hours as needed. 06/20/16   Triplett, Lynelle Smoke, PA-C    Family History No family history on file.  Social History Social History   Tobacco Use  . Smoking status: Former Research scientist (life sciences)  . Smokeless tobacco: Current User  Substance  Use Topics  . Alcohol use: No    Frequency: Never  . Drug use: No     Allergies   Patient has no known allergies.   Review of Systems Review of Systems  HENT: Negative.   Eyes: Negative for visual disturbance.  Respiratory: Negative for shortness of breath.   Cardiovascular: Negative for chest pain.  Musculoskeletal: Positive for arthralgias and joint swelling.  Skin: Negative for wound.  Neurological: Negative for weakness, numbness and headaches.     Physical Exam Updated Vital Signs BP (!) 187/102 (BP Location: Right Arm)   Pulse (!) 114   Temp 97.9 F (36.6 C) (Tympanic)   Resp 20   Ht 5\' 8"  (1.727 m)   Wt 99.8 kg (220 lb)   SpO2 100%   BMI 33.45 kg/m   Physical Exam  Constitutional: He appears well-developed and well-nourished.  HENT:  Head: Normocephalic.  Cardiovascular: Normal rate and intact distal pulses. Exam reveals no decreased pulses.  Pulses:      Dorsalis pedis pulses are 2+ on the right side, and 2+ on the left side.       Posterior tibial pulses are 2+ on the right side, and 2+ on the left side.  Musculoskeletal: He exhibits edema and tenderness.       Left ankle: He exhibits decreased range of motion and swelling. He exhibits no deformity, no laceration and normal pulse. Tenderness. Lateral malleolus, medial malleolus and head of 5th metatarsal tenderness found. No proximal fibula tenderness found. Achilles tendon normal.  Neurological: He is alert. No sensory deficit.  Skin: Skin is warm, dry and intact.  Nursing note and vitals reviewed.    ED Treatments / Results  Labs (all labs ordered are listed, but only abnormal results are displayed) Labs Reviewed - No data to display  EKG  EKG Interpretation None       Radiology Dg Ankle Complete Left  Result Date: 03/06/2017 CLINICAL DATA:  LEFT ankle and foot pain, fall with inversion injury, history diabetes mellitus, hypertension EXAM: LEFT ANKLE COMPLETE - 3+ VIEW COMPARISON:  None  FINDINGS: Lateral and anterior soft tissue swelling. Osseous mineralization normal. Joint spaces preserved. No acute fracture, dislocation, or bone destruction. IMPRESSION: No acute osseous abnormalities. Electronically Signed   By: Lavonia Dana M.D.   On: 03/06/2017 18:12   Dg Foot Complete Left  Result Date: 03/06/2017 CLINICAL DATA:  LEFT ankle and foot pain, fall with inversion injury, history diabetes mellitus, hypertension EXAM: LEFT FOOT - COMPLETE 3+ VIEW COMPARISON:  None FINDINGS: Osseous mineralization normal. Joint spaces preserved. No fracture, dislocation, or bone destruction. IMPRESSION: Normal exam. Electronically Signed   By: Lavonia Dana M.D.   On: 03/06/2017 18:14    Procedures Procedures (including critical care time)  Medications Ordered in ED Medications  HYDROcodone-acetaminophen (NORCO/VICODIN) 5-325 MG per tablet 1 tablet (  1 tablet Oral Given 03/06/17 1724)     Initial Impression / Assessment and Plan / ED Course  I have reviewed the triage vital signs and the nursing notes.  Pertinent labs & imaging results that were available during my care of the patient were reviewed by me and considered in my medical decision making (see chart for details).     Imaging reviewed and negative for acute fracture, pt placed in aso, has crutches,  RICE, plan f/u with pcp for a recheck in one week if sx persist or are not improving. May need pt prn if sx do not improve with home tx. Nsaids contraindicated given elevated bp, hydrocodone prescribed. Advised recheck of bp when pain improved.  [place] substance [place] reviewed with no suspicious patterns.   Final Clinical Impressions(s) / ED Diagnoses   Final diagnoses:  Sprain of left ankle, unspecified ligament, initial encounter  Essential hypertension    ED Discharge Orders        Ordered    HYDROcodone-acetaminophen (NORCO/VICODIN) 5-325 MG tablet  Every 4 hours PRN     03/06/17 1821       Evalee Jefferson,  Hershal Coria 03/06/17 Ezzard Flax, MD 03/08/17 1606

## 2017-06-16 DIAGNOSIS — Z1389 Encounter for screening for other disorder: Secondary | ICD-10-CM | POA: Diagnosis not present

## 2017-06-16 DIAGNOSIS — I10 Essential (primary) hypertension: Secondary | ICD-10-CM | POA: Diagnosis not present

## 2017-06-16 DIAGNOSIS — E1165 Type 2 diabetes mellitus with hyperglycemia: Secondary | ICD-10-CM | POA: Diagnosis not present

## 2017-06-16 DIAGNOSIS — E6609 Other obesity due to excess calories: Secondary | ICD-10-CM | POA: Diagnosis not present

## 2017-06-16 DIAGNOSIS — Z6830 Body mass index (BMI) 30.0-30.9, adult: Secondary | ICD-10-CM | POA: Diagnosis not present

## 2017-06-23 DIAGNOSIS — G894 Chronic pain syndrome: Secondary | ICD-10-CM | POA: Diagnosis not present

## 2017-06-23 DIAGNOSIS — M545 Low back pain: Secondary | ICD-10-CM | POA: Diagnosis not present

## 2017-06-23 DIAGNOSIS — M47817 Spondylosis without myelopathy or radiculopathy, lumbosacral region: Secondary | ICD-10-CM | POA: Diagnosis not present
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
## 2017-08-05 ENCOUNTER — Ambulatory Visit: Payer: 59 | Admitting: Cardiovascular Disease

## 2017-08-05 ENCOUNTER — Encounter: Payer: Self-pay | Admitting: Cardiovascular Disease

## 2017-08-05 VITALS — BP 180/84 | HR 87 | Ht 68.0 in | Wt 204.0 lb

## 2017-08-05 DIAGNOSIS — I10 Essential (primary) hypertension: Secondary | ICD-10-CM

## 2017-08-05 DIAGNOSIS — I739 Peripheral vascular disease, unspecified: Secondary | ICD-10-CM

## 2017-08-05 MED ORDER — AMLODIPINE BESYLATE 10 MG PO TABS: 10.0000 mg | ORAL_TABLET | Freq: Every day | ORAL | 3 refills | Status: DC

## 2017-08-05 NOTE — Patient Instructions (Signed)
Medication Instructions:  Increase amlodipine to 10 mg daily   Labwork: none  Testing/Procedures: Your physician has requested that you have a carotid duplex. This test is an ultrasound of the carotid arteries in your neck. It looks at blood flow through these arteries that supply the brain with blood. Allow one hour for this exam. There are no restrictions or special instructions.  Your physician has requested that you have an ankle brachial index (ABI). During this test an ultrasound and blood pressure cuff are used to evaluate the arteries that supply the arms and legs with blood. Allow thirty minutes for this exam. There are no restrictions or special instructions.  Your physician has requested that you have a lower extremity arterial exercise duplex. During this test, exercise and ultrasound are used to evaluate arterial blood flow in the legs. Allow one hour for this exam. There are no restrictions or special instructions.   Follow-Up: Your physician recommends that you schedule a follow-up appointment in: with DR. BERRY ([place])    Any Other Special Instructions Will Be Listed Below (If Applicable).  YOU HAVE BEEN REFERRED TO DR. [name] for Peripheral Vascular Disease, someone will contact you from Dr. Kennon Holter office    If you need a refill on your cardiac medications before your next appointment, please call your pharmacy.

## 2017-08-06 ENCOUNTER — Ambulatory Visit (HOSPITAL_COMMUNITY)
Admission: RE | Admit: 2017-08-06 | Discharge: 2017-08-06 | Disposition: A | Discharge status: Home / Self Care | Payer: 59 | Source: Ambulatory Visit | Attending: Cardiovascular Disease | Admitting: Cardiovascular Disease

## 2017-08-06 ENCOUNTER — Other Ambulatory Visit: Payer: Self-pay | Admitting: Cardiovascular Disease

## 2017-08-06 ENCOUNTER — Telehealth: Payer: Self-pay

## 2017-08-06 DIAGNOSIS — I739 Peripheral vascular disease, unspecified: Secondary | ICD-10-CM

## 2017-08-06 DIAGNOSIS — I6523 Occlusion and stenosis of bilateral carotid arteries: Secondary | ICD-10-CM | POA: Insufficient documentation

## 2017-08-06 DIAGNOSIS — I70213 Atherosclerosis of native arteries of extremities with intermittent claudication, bilateral legs: Secondary | ICD-10-CM | POA: Diagnosis not present

## 2017-08-06 NOTE — Telephone Encounter (Signed)
-----   Message from Josue Hector, MD sent at 08/06/2017  2:49 PM EDT ----- No evidence of leg blockages

## 2017-08-06 NOTE — Telephone Encounter (Signed)
Called pt. No answer. Left message for pt to return call.  

## 2017-08-07 ENCOUNTER — Ambulatory Visit (HOSPITAL_COMMUNITY): Admission: RE | Admit: 2017-08-07 | Payer: 59 | Source: Ambulatory Visit

## 2017-08-07 ENCOUNTER — Ambulatory Visit (HOSPITAL_COMMUNITY): Payer: 59

## 2017-08-18 DIAGNOSIS — E1165 Type 2 diabetes mellitus with hyperglycemia: Secondary | ICD-10-CM | POA: Diagnosis not present

## 2017-08-21 ENCOUNTER — Ambulatory Visit: Payer: 59 | Admitting: Cardiovascular Disease

## 2017-09-09 DIAGNOSIS — E1165 Type 2 diabetes mellitus with hyperglycemia: Secondary | ICD-10-CM | POA: Diagnosis not present

## 2017-09-18 ENCOUNTER — Ambulatory Visit: Payer: 59 | Admitting: Cardiovascular Disease

## 2017-10-16 ENCOUNTER — Ambulatory Visit: Payer: 59 | Admitting: Cardiovascular Disease

## 2018-01-04 DIAGNOSIS — R109 Unspecified abdominal pain: Secondary | ICD-10-CM | POA: Diagnosis not present

## 2018-01-04 DIAGNOSIS — E7800 Pure hypercholesterolemia, unspecified: Secondary | ICD-10-CM | POA: Diagnosis not present

## 2018-01-04 DIAGNOSIS — Z0000 Encounter for general adult medical examination without abnormal findings: Secondary | ICD-10-CM | POA: Diagnosis not present

## 2018-01-04 DIAGNOSIS — Z6833 Body mass index (BMI) 33.0-33.9, adult: Secondary | ICD-10-CM | POA: Diagnosis not present

## 2018-01-04 DIAGNOSIS — E1140 Type 2 diabetes mellitus with diabetic neuropathy, unspecified: Secondary | ICD-10-CM | POA: Diagnosis not present

## 2018-01-04 DIAGNOSIS — E6609 Other obesity due to excess calories: Secondary | ICD-10-CM | POA: Diagnosis not present

## 2018-07-06 ENCOUNTER — Other Ambulatory Visit: Payer: Self-pay

## 2018-07-06 ENCOUNTER — Other Ambulatory Visit (HOSPITAL_COMMUNITY): Payer: Self-pay | Admitting: Internal Medicine

## 2018-07-06 ENCOUNTER — Ambulatory Visit (HOSPITAL_COMMUNITY)
Admission: RE | Admit: 2018-07-06 | Discharge: 2018-07-06 | Disposition: A | Discharge status: Home / Self Care | Payer: 59 | Source: Ambulatory Visit | Attending: Internal Medicine | Admitting: Internal Medicine

## 2018-07-06 DIAGNOSIS — R51 Headache: Secondary | ICD-10-CM | POA: Diagnosis not present

## 2018-07-06 DIAGNOSIS — G458 Other transient cerebral ischemic attacks and related syndromes: Secondary | ICD-10-CM | POA: Diagnosis not present

## 2018-07-06 DIAGNOSIS — G459 Transient cerebral ischemic attack, unspecified: Secondary | ICD-10-CM | POA: Diagnosis not present

## 2018-07-06 DIAGNOSIS — R519 Headache, unspecified: Secondary | ICD-10-CM

## 2018-07-06 DIAGNOSIS — S0990XA Unspecified injury of head, initial encounter: Secondary | ICD-10-CM | POA: Diagnosis not present

## 2018-07-08 DIAGNOSIS — Z7689 Persons encountering health services in other specified circumstances: Secondary | ICD-10-CM | POA: Diagnosis not present

## 2018-07-08 DIAGNOSIS — Z681 Body mass index (BMI) 19 or less, adult: Secondary | ICD-10-CM | POA: Diagnosis not present

## 2018-07-08 DIAGNOSIS — I10 Essential (primary) hypertension: Secondary | ICD-10-CM | POA: Diagnosis not present

## 2018-07-08 DIAGNOSIS — J329 Chronic sinusitis, unspecified: Secondary | ICD-10-CM | POA: Diagnosis not present

## 2018-07-08 DIAGNOSIS — E1142 Type 2 diabetes mellitus with diabetic polyneuropathy: Secondary | ICD-10-CM | POA: Diagnosis not present

## 2018-07-21 ENCOUNTER — Other Ambulatory Visit: Payer: Self-pay

## 2018-07-21 ENCOUNTER — Encounter: Payer: Self-pay | Admitting: Neurology

## 2018-07-21 ENCOUNTER — Ambulatory Visit (INDEPENDENT_AMBULATORY_CARE_PROVIDER_SITE_OTHER): Payer: 59 | Admitting: Neurology

## 2018-07-21 DIAGNOSIS — G4710 Hypersomnia, unspecified: Secondary | ICD-10-CM

## 2018-07-21 DIAGNOSIS — R569 Unspecified convulsions: Secondary | ICD-10-CM

## 2018-07-21 DIAGNOSIS — F0781 Postconcussional syndrome: Secondary | ICD-10-CM | POA: Diagnosis not present

## 2018-07-21 NOTE — Progress Notes (Addendum)
Virtual Visit via Video Note  I connected with Kurt [name] on 07/21/18 at  1:00 PM EDT by a video enabled telemedicine application and verified that I am speaking with the correct person using two identifiers.  This visit was initially attempted using [email or web address] app but due to technical difficulties my camera was not working hence after discussion with patient it was switched to face time visit using video and audio  Location: Patient: At his home Provider: At [facility] office Ref Md : Dr Gerarda Fraction Reason for referral : Headache and memory loss  I discussed the limitations of evaluation and management by telemedicine and the availability of in person appointments. The patient expressed understanding and agreed to proceed.  History of Present Illness: Kurt [name] is a 58 year old Caucasian male who has been having headache dizziness and some vision disturbance following a fall a month ago.  History is obtained from the patient and review of electronic medical records and have personally reviewed imaging films in PACS.  Patient states that about a month ago he fell while stepping over a small fence and hit the back of his head on a concrete floor.  He had brief loss of consciousness for a few seconds and when he woke up noticed his head was spinning and he almost blacked out.  He was felt off balance as well as dizzy for several days.  He appeared to be improving but about a week ago he slept from Sunday night and did not wake up till Tuesday morning causing concern.  When he woke up he felt groggy a little bit but did not have any tongue bite, incontinence or bruising.  Patient has also noticed in recent weeks that his vision is blurred and he has sees dark spots in front of his eyes particularly when he is in bright sunlight.  He has difficulty focusing.  Is also complaining of mild headaches but these are not bad and he is not required any medication for it.  He does have chronic neck pain but this is  unchanged over the years.  He did have a CT scan of the head done on 07/06/2018 which I personally reviewed and shows no acute abnormalities.  Patient does admit to mild short-term memory difficulties which she has had for a while but these appears a bit more pronounced now.  He has trouble remembering recent names and information.  On inquiry admits to an episode of garbled speech that he had lasting 2 minutes a few years ago.  His wife is with him and she could not understand him.  He denied any accompanying symptoms at that time.  He did not seek medical help or evaluation for these symptoms.  At present is denying significant headache, vertigo, double vision, gait or balance problems.  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  He denies is feeling depressed or anxious.  He has no definite past history of proven strokes migraine seizures or significant neurological problems. Past medical history significant for obesity, diabetes, chronic sinusitis, peptic ulcer disease, hypertension, atherosclerotic vascular disease Medication list Zanaflex, Pro Air inhaler, Pravachol, Trental, nitroglycerin, Jentadueto XR, insulin, Amaryl, Neurontin, Cardura, Flexeril, co-Q10, Lotensin, aspirin, Augmentin, Norvasc  Social history patient is an ex-smoker quit.  Denies drinking alcohol. 14 system review of systems is positive only for those symptoms documented in history of present illness and all other systems negative Observations/Objective: physical and neurological exam is limited due to constraints from  video visit.  Patient is obese he appears comfortable and not in distress.  He is awake alert oriented to time place and person.  Speech appears clear without dysarthria or aphasia.  Is able to name and repeat and comprehend quite well.  He has intact attention registration.  Recall is diminished 2/3.  He is able to name 20 animals which walk on 4 legs.  He is able to  copy intersecting pentagons well.  Clock drawing is 4/4.  Extraocular movements are full range without nystagmus.  Face is symmetric without weakness.  Tongue is midline.  Motor system exam shows symmetric upper and lower extremity strength without focal weakness.  He can get up easily and walks with a fairly steady gait.  He can stand with slight support on either foot unsupported and on his heels and toes.  Tandem walking was not addressed.  Assessment  58 year old gentleman with history of closed head injury followed by transient dizziness as well as mild headache and short-term memory difficulties likely representing postconcussion syndrome.  He had episode of hypersomnolence and slept a day and a half which is of unclear etiology and needs further evaluation  Plan: I had a long discussion with the patient regarding his closed head injury followed by his multifocal symptoms likely represent postconcussion syndrome.  I recommend further evaluation by checking MRI scan of the brain with and without contrast as well as an EEG for any seizure activity.  I recommend he do increase participation in cognitively challenging tasks like solving crossword puzzles, playing bridge and sudoku.  I expect his symptoms to improve and he will return for follow-up and in person visit in a month for more detailed cognitive testing if not improved.  He was advised to call me if his symptoms got worse or he had new symptoms.  Follow Up Instructions:    I discussed the assessment and treatment plan with the patient. The patient was provided an opportunity to ask questions and all were answered. The patient agreed with the plan and demonstrated an understanding of the instructions.   The patient was advised to call back or seek an in-person evaluation if the symptoms worsen or if the condition fails to improve as anticipated.  I provided  45 minutes of non-face-to-face time during this encounter.   Antony Contras,  MD

## 2018-07-23 ENCOUNTER — Other Ambulatory Visit: Payer: Self-pay | Admitting: Cardiovascular Disease

## 2018-07-28 ENCOUNTER — Telehealth: Payer: Self-pay | Admitting: Neurology

## 2018-07-28 NOTE — Telephone Encounter (Signed)
[employer] pending faxed notes 

## 2018-07-29 DIAGNOSIS — M25551 Pain in right hip: Secondary | ICD-10-CM | POA: Diagnosis not present

## 2018-07-29 DIAGNOSIS — G894 Chronic pain syndrome: Secondary | ICD-10-CM | POA: Diagnosis not present

## 2018-07-29 DIAGNOSIS — M4326 Fusion of spine, lumbar region: Secondary | ICD-10-CM | POA: Diagnosis not present

## 2018-07-29 DIAGNOSIS — M545 Low back pain: Secondary | ICD-10-CM | POA: Diagnosis not present

## 2018-07-29 DIAGNOSIS — M25522 Pain in left elbow: Secondary | ICD-10-CM | POA: Diagnosis not present

## 2018-07-29 NOTE — Telephone Encounter (Signed)
spoke to the patient due to the cost he is going to speak to his wife and get back to me  [place]: [id number] (exp. 07/28/18 to 09/11/18)

## 2018-08-04 ENCOUNTER — Ambulatory Visit (INDEPENDENT_AMBULATORY_CARE_PROVIDER_SITE_OTHER): Payer: 59 | Admitting: Neurology

## 2018-08-04 ENCOUNTER — Other Ambulatory Visit: Payer: Self-pay

## 2018-08-04 DIAGNOSIS — R569 Unspecified convulsions: Secondary | ICD-10-CM | POA: Diagnosis not present

## 2018-08-16 ENCOUNTER — Telehealth: Payer: Self-pay | Admitting: Cardiovascular Disease

## 2018-08-16 NOTE — Telephone Encounter (Signed)
New Message     Katharine Look said a Disability form was sent on May 18th and she is calling to follow up on it     Please call

## 2018-08-18 ENCOUNTER — Encounter (INDEPENDENT_AMBULATORY_CARE_PROVIDER_SITE_OTHER): Payer: Self-pay | Admitting: *Deleted

## 2018-08-18 ENCOUNTER — Other Ambulatory Visit: Payer: Self-pay | Admitting: Internal Medicine

## 2018-08-18 ENCOUNTER — Other Ambulatory Visit (HOSPITAL_COMMUNITY): Payer: Self-pay | Admitting: Internal Medicine

## 2018-08-18 DIAGNOSIS — R0989 Other specified symptoms and signs involving the circulatory and respiratory systems: Secondary | ICD-10-CM

## 2018-08-25 ENCOUNTER — Ambulatory Visit (HOSPITAL_COMMUNITY)
Admission: RE | Admit: 2018-08-25 | Discharge: 2018-08-25 | Disposition: A | Discharge status: Home / Self Care | Payer: 59 | Source: Ambulatory Visit | Attending: Internal Medicine | Admitting: Internal Medicine

## 2018-08-25 ENCOUNTER — Encounter: Payer: Self-pay | Admitting: Gastroenterology

## 2018-08-25 ENCOUNTER — Other Ambulatory Visit: Payer: Self-pay

## 2018-08-25 ENCOUNTER — Telehealth: Payer: Self-pay

## 2018-08-25 DIAGNOSIS — R0989 Other specified symptoms and signs involving the circulatory and respiratory systems: Secondary | ICD-10-CM | POA: Insufficient documentation

## 2018-08-25 NOTE — Telephone Encounter (Signed)
I called pt that his appt will be in office due to last one being video. I explain we are still taking extreme precautions due to COVID 19 pandemic.I explain he will check outside in his car with our staff and have temperature and screening questions done. He will have to wear a mask and he is the only one allowed in the exam room with provider. I ask pt the COVID 19 questions and he has not been exposed to virus, or had the virus. NO fevers, shortness of breath, travel outside of the state in the last 3 months. I advise pt he will have to wait in car and nurse will call him when ready for visit. PT verbalized understanding.

## 2018-08-27 NOTE — Telephone Encounter (Signed)
Message routed to [facility]

## 2018-08-30 ENCOUNTER — Ambulatory Visit (INDEPENDENT_AMBULATORY_CARE_PROVIDER_SITE_OTHER): Payer: 59 | Admitting: Neurology

## 2018-08-30 ENCOUNTER — Other Ambulatory Visit: Payer: Self-pay

## 2018-08-30 ENCOUNTER — Encounter: Payer: Self-pay | Admitting: Neurology

## 2018-08-30 VITALS — BP 138/92 | HR 99 | Temp 97.3°F | Wt 218.4 lb

## 2018-08-30 DIAGNOSIS — R42 Dizziness and giddiness: Secondary | ICD-10-CM | POA: Diagnosis not present

## 2018-08-30 NOTE — Progress Notes (Signed)
[facility] [address] [place]. [place] [address] [phone]       OFFICE FOLLOW-UP NOTE  Mr. Kurt [name] Date of Birth:  04-28-1960 Medical Record Number:  [id number]   HPI: Initial video consult visit 07/21/2018: Kurt [name] is a 58 year old Caucasian male who has been having headache dizziness and some vision disturbance following a fall a month ago.  History is obtained from the patient and review of electronic medical records and have personally reviewed imaging films in PACS.  Patient states that about a month ago he fell while stepping over a small fence and hit the back of his head on a concrete floor.  He had brief loss of consciousness for a few seconds and when he woke up noticed his head was spinning and he almost blacked out.  He was felt off balance as well as dizzy for several days.  He appeared to be improving but about a week ago he slept from Sunday night and did not wake up till Tuesday morning causing concern.  When he woke up he felt groggy a little bit but did not have any tongue bite, incontinence or bruising.  Patient has also noticed in recent weeks that his vision is blurred and he has sees dark spots in front of his eyes particularly when he is in bright sunlight.  He has difficulty focusing.  Is also complaining of mild headaches but these are not bad and he is not required any medication for it.  He does have chronic neck pain but this is unchanged over the years.  He did have a CT scan of the head done on 07/06/2018 which I personally reviewed and shows no acute abnormalities.  Patient does admit to mild short-term memory difficulties which she has had for a while but these appears a bit more pronounced now.  He has trouble remembering recent names and information.  On inquiry admits to an episode of garbled speech that he had lasting 2 minutes a few years ago.  His wife is with him and she could not understand him.  He denied any accompanying  symptoms at that time.  He did not seek medical help or evaluation for these symptoms.  At present is denying significant headache, vertigo, double vision, gait or balance problems.  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  He denies is feeling depressed or anxious.  He has no definite past history of proven strokes migraine seizures or significant neurological problems. Update 08/30/2018 : He returns for follow-up after last visit 6 weeks ago.  He states is doing better.  His short-term memory is a lot better is back to normal.  He is also not having any significant dizziness.  He still has occasional headaches behind his eyes and has some blurred vision and seeing spots in the left eye but these also appear improving.  He feels this may be related to stress.  He is under significant stress because of his son who is been repeatedly sick with drug addiction.  The patient had EEG done on 08/05/2018 which was normal.  The patient stated he did not do the MRI as he could not afford it and may consider doing it later once he gets his disability or Medicaid.  He had a recent carotid ultrasound done on [facility] which showed mixed plaque at the right carotid bifurcation with 50 to 69% right ICA stenosis.  His primary  physician has apparently referred him to Dr. Donnetta Hutching for his opinion.  Patient denies any symptoms suggestive of right hemispheric stroke or TIA.  Patient is currently fighting disability and has a hearing coming up in the end of the month and he admits to being quite stressed out about this.  He has no new complaints today. ROS:   14 system review of systems is positive for stress blurred vision, headache and all other systems negative  PMH:  Past Medical History:  Diagnosis Date   Diabetes mellitus without complication (HCC)    Hypertension    Obesity     Social History:  Social History   Socioeconomic History    Marital status: Married    Spouse name: Not on file   Number of children: Not on file   Years of education: Not on file   Highest education level: Not on file  Occupational History   Not on file  Social Needs   Financial resource strain: Not on file   Food insecurity    Worry: Not on file    Inability: Not on file   Transportation needs    Medical: Not on file    Non-medical: Not on file  Tobacco Use   Smoking status: Former Smoker   Smokeless tobacco: Current User  Substance and Sexual Activity   Alcohol use: No    Frequency: Never   Drug use: No   Sexual activity: Not on file  Lifestyle   Physical activity    Days per week: Not on file    Minutes per session: Not on file   Stress: Not on file  Relationships   Social connections    Talks on phone: Not on file    Gets together: Not on file    Attends religious service: Not on file    Active member of club or organization: Not on file    Attends meetings of clubs or organizations: Not on file    Relationship status: Not on file   Intimate partner violence    Fear of current or ex partner: Not on file    Emotionally abused: Not on file    Physically abused: Not on file    Forced sexual activity: Not on file  Other Topics Concern   Not on file  Social History Narrative   Not on file    Medications:   Current Outpatient Medications on File Prior to Visit  Medication Sig Dispense Refill   amLODipine (NORVASC) 5 MG tablet      amoxicillin-clavulanate (AUGMENTIN) 875-125 MG tablet      aspirin 325 MG tablet Take 325 mg by mouth daily.     benazepril (LOTENSIN) 20 MG tablet Take 20 mg by mouth daily.      Coenzyme Q10 (CO Q-10) 100 MG CAPS Take by mouth.     cyclobenzaprine (FLEXERIL) 10 MG tablet Take 10 mg by mouth 3 (three) times daily as needed for muscle spasms.      doxazosin (CARDURA) 4 MG tablet Take 4 mg by mouth every evening.      gabapentin (NEURONTIN) 300 MG capsule Take 600-900  mg by mouth 3 (three) times daily.      glimepiride (AMARYL) 4 MG tablet Take 4 mg by mouth 2 (two) times daily.     HUMALOG KWIKPEN 100 UNIT/ML KwikPen      IBU 800 MG tablet Take 800 mg by mouth every 8 (eight) hours as needed for mild pain or moderate pain.  Insulin Glargine (BASAGLAR KWIKPEN) 100 UNIT/ML SOPN Inject 45 Units into the skin every evening.      linaGLIPtin-metFORMIN HCl ER (JENTADUETO XR) 2.07-998 MG TB24 Take by mouth.     nitroGLYCERIN (NITROSTAT) 0.4 MG SL tablet Place 0.4 mg under the tongue.     pentoxifylline (TRENTAL) 400 MG CR tablet Take 400 mg by mouth 3 (three) times daily.     pravastatin (PRAVACHOL) 80 MG tablet      PROAIR HFA 108 (90 Base) MCG/ACT inhaler Inhale 1-2 puffs into the lungs every 6 (six) hours as needed for wheezing or shortness of breath.      tiZANidine (ZANAFLEX) 4 MG tablet Take 4 mg by mouth every 8 (eight) hours as needed for muscle spasms.      No current facility-administered medications on file prior to visit.     Allergies:  No Known Allergies  Physical Exam General: Obese middle-aged Caucasian male, seated, in no evident distress Head: head normocephalic and atraumatic.  Neck: supple with no carotid or supraclavicular bruits Cardiovascular: regular rate and rhythm, no murmurs Musculoskeletal: no deformity Skin:  no rash/petichiae Vascular:  Normal pulses all extremities Vitals:   08/30/18 1528  BP: (!) 138/92  Pulse: 99  Temp: (!) 97.3 F (36.3 C)   Neurologic Exam Mental Status: Awake and fully alert. Oriented to place and time. Recent and remote memory intact. Attention span, concentration and fund of knowledge appropriate. Mood and affect appropriate.  Recall 3/3.  Able to name 13 animals which walk on 4 legs. Cranial Nerves: Fundoscopic exam reveals sharp disc margins. Pupils equal, briskly reactive to light. Extraocular movements full without nystagmus. Visual fields full to confrontation. Hearing intact.  Facial sensation intact. Face, tongue, palate moves normally and symmetrically.  Motor: Normal bulk and tone. Normal strength in all tested extremity muscles. Sensory.: intact to touch ,pinprick .position but diminished vibratory sensation bilaterally from ankle down.  Coordination: Rapid alternating movements normal in all extremities. Finger-to-nose and heel-to-shin performed accurately bilaterally. Gait and Station: Arises from chair without difficulty. Stance is slightly broad-based.  Unsteady on a narrow base.  Unable to walk tandem  Reflexes: 1+ and symmetric except both ankle jerks which are depressed. Toes downgoing.    ASSESSMENT: 58 year old gentleman with history of closed head injury followed by transient dizziness as well as mild headache and short-term memory difficulties likely representing postconcussion syndrome which appears to be improving.  He had episode of hypersomnolence and slept a day and a half which is of unclear etiology perhaps due to underlying stress.H/o TIA few years ago with garbled speech episode. Vascular risk factors for HT, DM, Lipids, Obesity.  He also has mild diabetic neuropathy    PLAN: I had a long discussion with the patient regarding his multifocal symptoms of memory loss, dizziness, headache and vision which appear to be improving.  I strongly encouraged him to do stress laxation activities on a regular basis like meditation and yoga to help him deal with his stressful situation better.  He has asymptomatic moderate right carotid stenosis on recent ultrasound which I think can be managed medically.  I recommend aggressive risk factor modification and continue aspirin 325 mg daily for stroke prevention along with strict control of hypertension with blood pressure goal below 130/90, diabetes with hemoglobin A1c goal below 6.5% and lipids with LDL cholesterol goal below 70 mg percent.  I recommend he eat a healthy diet with lots of fruits, vegetables, cereals,  whole grains and also be active and  exercise 30 minutes every day.  He may return for follow-up in the future only as needed and no routine schedule appointment was made. Greater than 50% of time during this 25 minute visit was spent on counseling,explanation of diagnosis of concussion, stress and neuropathy, planning of further management, discussion with patient and family and coordination of care Antony Contras, MD [facility] [address] [place], [place] [address]  Phone [phone] Fax [phone] Note: This document was prepared with digital dictation and possible smart phrase technology. Any transcriptional errors that result from this process are unintentional

## 2018-08-30 NOTE — Patient Instructions (Signed)
I had a long discussion with the patient regarding his multifocal symptoms of memory loss, dizziness, headache and vision which appear to be improving.  I strongly encouraged him to do stress laxation activities on a regular basis like meditation and yoga to help him deal with his stressful situation better.  He has asymptomatic moderate right carotid stenosis on recent ultrasound which I think can be managed medically.  I recommend aggressive risk factor modification and continue aspirin 325 mg daily for stroke prevention along with strict control of hypertension with blood pressure goal below 130/90, diabetes with hemoglobin A1c goal below 6.5% and lipids with LDL cholesterol goal below 70 mg percent.  I recommend he eat a healthy diet with lots of fruits, vegetables, cereals, whole grains and also be active and exercise 30 minutes every day.  He may return for follow-up in the future only as needed and no routine schedule appointment was made.  Carotid Artery Disease  The carotid arteries are arteries on both sides of the neck. They carry blood to the brain, face, and neck. Carotid artery disease happens when these arteries become smaller (narrow) or get blocked. If these arteries become smaller or get blocked, you are more likely to have a stroke or a warning stroke (transient ischemic attack). Follow these instructions at home:  Take over-the-counter and prescription medicines only as told by your doctor.  Make sure you understand all instructions about your medicines. Do not stop taking your medicines without talking to your doctor first.  Follow your doctor's diet instructions. It is important to follow a healthy diet. ? Eat foods that include plenty of: ? Fresh fruits. ? Vegetables. ? Lean meats. ? Avoid these foods: ? Foods that are high in fat. ? Foods that are high in salt (sodium). ? Foods that are fried. ? Foods that are processed. ? Foods that have few good nutrients (poor  nutritional value).  Keep a healthy weight.  Stay active. Get at least 30 minutes of activity every day.  Do not smoke.  Limit alcohol use to: ? No more than 2 drinks a day for men. ? No more than 1 drink a day for women who are not pregnant.  Do not use illegal drugs.  Keep all follow-up visits as told by your doctor. This is important. Contact a doctor if: Get help right away if:  You have any symptoms of stroke or TIA. The acronym BEFAST is an easy way to remember the main warning signs of stroke. ? B = Balance problems. Signs include dizziness, sudden trouble walking, or loss of balance ? E = Eye problems. This includes trouble seeing or a sudden change in vision. ? F = Face changes. This includes sudden weakness or numbness of the face, or the face or eyelid drooping to one side. ? A = Arm weakness or numbness. This happens suddenly and usually on one side of the body. ? S = Speech problems. This includes trouble speaking or trouble understanding. ? T = Time. Time to call 911 or seek emergency care. Do not wait to see if symptoms go away. Make note of the time your symptoms started.  Other signs of stroke may include: ? A sudden, severe headache with no known cause. ? Feeling sick to your stomach (nauseous) or throwing up (vomiting). ? Seizure. Call your local emergency services (911 in [place] Do notdrive yourself to the clinic or hospital. Summary  The carotid arteries are arteries on both sides of the neck.  If these arteries get smaller or get blocked, you are more likely to have a stroke or a warning stroke (transient ischemic attack).  Take over-the-counter and prescription medicines only as told by your doctor.  Keep all follow-up visits as told by your doctor. This is important. This information is not intended to replace advice given to you by your health care provider. Make sure you discuss any questions you have with your health care provider. Document  Released: 02/18/2012 Document Revised: 02/26/2017 Document Reviewed: 02/26/2017 Elsevier Interactive Patient Education  2019 [employer].

## 2018-09-07 ENCOUNTER — Telehealth: Payer: Self-pay | Admitting: Cardiovascular Disease

## 2018-09-07 NOTE — Telephone Encounter (Signed)
Paperwork received from [employer]. Placed in Dr. Kyla Balzarine box for review.

## 2018-09-07 NOTE — Telephone Encounter (Signed)
  Mitzi Hansen from [employer] is calling to check on the status of Disability paperwork that was faxed over for the patient  REF [phone]

## 2018-09-07 NOTE — Telephone Encounter (Signed)
I will route this call to HIM Dept. I will also route as FYI to Dr. Kyla Balzarine nurse Jeannene Patella.

## 2018-09-08 NOTE — Telephone Encounter (Signed)
Will send message to Dr. Johnsie Cancel to let him know and the nurse that will be covering him this summer.

## 2018-09-13 ENCOUNTER — Ambulatory Visit: Payer: 59 | Admitting: *Deleted

## 2018-09-13 ENCOUNTER — Other Ambulatory Visit: Payer: Self-pay

## 2018-09-13 VITALS — Ht 68.0 in | Wt 200.0 lb

## 2018-09-13 DIAGNOSIS — Z1211 Encounter for screening for malignant neoplasm of colon: Secondary | ICD-10-CM

## 2018-09-13 MED ORDER — PEG 3350-KCL-NA BICARB-NACL 420 G PO SOLR: 4000.0000 mL | Freq: Once | ORAL | 0 refills | Status: AC

## 2018-09-13 NOTE — Progress Notes (Signed)
Patient's pre-visit was done today over the phone with the patient due to COVID-19 pandemic. Name,DOB and address verified. Insurance verified. Packet of Prep instructions mailed to patient including copy of a consent form and pre-procedure patient acknowledgement form-pt is aware. Patient understands to call us back with any questions or concerns. Patient denies any allergies to eggs or soy. Patient denies any problems with anesthesia/sedation. Patient denies any oxygen use at home. Patient denies taking any diet/weight loss medications or blood thinners. EMMI education assisgned to patient on colonoscopy, this was explained and instructions given to patient. Pt is aware that care partner will wait in the car in the parking lot; if they feel like they will be too hot to wait in the car; they may wait in the lobby.  We want them to wear a mask (we do not have any that we can provide them), practice social distancing, and we will check their temperatures when they get here.  I did remind patient that their care partner needs to stay in the parking lot the entire time. Pt will wear mask into building

## 2018-09-15 ENCOUNTER — Encounter: Payer: Self-pay | Admitting: Gastroenterology

## 2018-09-15 DIAGNOSIS — Z860100 Personal history of colon polyps, unspecified: Secondary | ICD-10-CM

## 2018-09-15 DIAGNOSIS — K649 Unspecified hemorrhoids: Secondary | ICD-10-CM

## 2018-09-15 HISTORY — DX: Personal history of colonic polyps: Z86.010

## 2018-09-15 HISTORY — DX: Unspecified hemorrhoids: K64.9

## 2018-09-15 HISTORY — DX: Personal history of colon polyps, unspecified: Z86.0100

## 2018-09-15 NOTE — Telephone Encounter (Signed)
Per Dr. Johnsie Cancel, patient has no cardiac reason for disability therefore he will not complete paperwork I attempted to call [employer] but was disconnected after a long wait

## 2018-09-15 NOTE — Telephone Encounter (Signed)
Follow up:   Ovid Curd calling from [place]  to follow up on paper work. [phone]

## 2018-09-23 ENCOUNTER — Telehealth: Payer: Self-pay | Admitting: Cardiovascular Disease

## 2018-09-23 NOTE — Telephone Encounter (Signed)
New message:    Dannielle Huh from disability concering this patients papers they fax on 08/17/18 then 06/23 [phone]

## 2018-09-24 ENCOUNTER — Encounter: Payer: Self-pay | Admitting: Gastroenterology

## 2018-09-24 ENCOUNTER — Other Ambulatory Visit: Payer: Self-pay

## 2018-09-24 ENCOUNTER — Ambulatory Visit (AMBULATORY_SURGERY_CENTER): Payer: 59 | Admitting: Gastroenterology

## 2018-09-24 VITALS — BP 106/49 | HR 75 | Temp 97.3°F | Resp 14 | Ht 68.0 in | Wt 200.0 lb

## 2018-09-24 DIAGNOSIS — Z1211 Encounter for screening for malignant neoplasm of colon: Secondary | ICD-10-CM

## 2018-09-24 DIAGNOSIS — D128 Benign neoplasm of rectum: Secondary | ICD-10-CM

## 2018-09-24 DIAGNOSIS — D125 Benign neoplasm of sigmoid colon: Secondary | ICD-10-CM | POA: Diagnosis not present

## 2018-09-24 DIAGNOSIS — D123 Benign neoplasm of transverse colon: Secondary | ICD-10-CM | POA: Diagnosis not present

## 2018-09-24 HISTORY — PX: COLONOSCOPY: SHX174

## 2018-09-24 MED ORDER — SODIUM CHLORIDE 0.9 % IV SOLN: 500.0000 mL | Freq: Once | INTRAVENOUS | Status: DC

## 2018-09-24 NOTE — Progress Notes (Signed)
To PACU, VSS. Report to [email or web address] 

## 2018-09-24 NOTE — Progress Notes (Signed)
  Pt's states no medical or surgical changes since previsit or office visit.  [facility] CMA vitals and Riki Sheer LPN temps. SM

## 2018-09-24 NOTE — Progress Notes (Signed)
Called to room to assist during endoscopic procedure.  Patient ID and intended procedure confirmed with present staff. Received instructions for my participation in the procedure from the performing physician.  

## 2018-09-24 NOTE — Op Note (Signed)
[facility] Patient Name: Kurt [name] Procedure Date: 09/24/2018 9:04 AM MRN: [id number] Endoscopist: Justice Britain , MD Age: 58 Referring MD:  Date of Birth: 08/30/1960 Gender: Male Account #: [id number] Procedure:                Colonoscopy Indications:              Screening for colorectal malignant neoplasm Medicines:                Monitored Anesthesia Care Procedure:                Pre-Anesthesia Assessment:                           - Prior to the procedure, a History and Physical                            was performed, and patient medications and                            allergies were reviewed. The patient's tolerance of                            previous anesthesia was also reviewed. The risks                            and benefits of the procedure and the sedation                            options and risks were discussed with the patient.                            All questions were answered, and informed consent                            was obtained. Prior Anticoagulants: The patient has                            taken no previous anticoagulant or antiplatelet                            agents. ASA Grade Assessment: II - A patient with                            mild systemic disease. After reviewing the risks                            and benefits, the patient was deemed in                            satisfactory condition to undergo the procedure.                           After obtaining informed consent, the colonoscope  was passed under direct vision. Throughout the                            procedure, the patient's blood pressure, pulse, and                            oxygen saturations were monitored continuously. The                            Colonoscope was introduced through the anus and                            advanced to the the cecum, identified by                            appendiceal orifice and  ileocecal valve. The                            colonoscopy was somewhat difficult due to                            inadequate bowel prep and significant looping.                            Successful completion of the procedure was aided by                            changing the patient's position, using manual                            pressure, withdrawing and reinserting the scope,                            straightening and shortening the scope to obtain                            bowel loop reduction and using scope torsion. The                            patient tolerated the procedure. The quality of the                            bowel preparation was inadequate. The ileocecal                            valve, appendiceal orifice, and rectum were                            photographed. Scope In: 9:18:45 AM Scope Out: 9:50:08 AM Scope Withdrawal Time: 0 hours 24 minutes 3 seconds  Total Procedure Duration: 0 hours 31 minutes 23 seconds  Findings:                 Hemorrhoids were found on perianal exam.  Skin tags were found on perianal exam with small                            tear in perineum.                           Extensive amounts of semi-liquid semi-solid stool                            was found in the entire colon, precluding                            visualization. Lavage of the area was performed                            using copious amounts, resulting in incomplete                            clearance with continued poor visualization.                           A 25 mm polyp was found in the cecum. The polyp was                            granular lateral spreading. Polypectomy was not                            attempted due to need for Advanced EMR technique                            and also poor colonscopy preparation.                           Five sessile polyps were found in the rectum,                            sigmoid  colon and transverse colon. The polyps were                            3 to 5 mm in size. These polyps were removed with a                            cold snare. Resection and retrieval were complete.                           Non-bleeding non-thrombosed external and internal                            hemorrhoids were found during retroflexion, during                            perianal exam and during digital exam. The  hemorrhoids were Grade II (internal hemorrhoids                            that prolapse but reduce spontaneously). Complications:            No immediate complications. Estimated Blood Loss:     Estimated blood loss was minimal. Impression:               - Preparation of the colon was inadequate.                           - Hemorrhoids found on perianal exam.                           - Perianal skin tags found on perianal exam.                           - Stool in the entire examined colon.                           - One 25 mm polyp in the cecum. Resection not                            attempted due to need to discuss Advanced EMR                            attempt as well as poor preparation in entire colon.                           - Five 3 to 5 mm polyps in the rectum, in the                            sigmoid colon and in the transverse colon, removed                            with a cold snare. Resected and retrieved.                           - Non-bleeding non-thrombosed external and internal                            hemorrhoids. Recommendation:           - The patient will be observed post-procedure,                            until all discharge criteria are met.                           - Discharge patient to home.                           - Patient has a contact number available for  emergencies. The signs and symptoms of potential                            delayed complications were discussed with  the                            patient. Return to normal activities tomorrow.                            Written discharge instructions were provided to the                            patient.                           - Resume previous diet.                           - Continue present medications.                           - Await pathology results.                           - Will set up a clinic appointment to discuss                            EMR/Advanced polyp resection and then plan to                            repeat colonoscopy at appointment to be scheduled                            within the next 39-month.                           - The findings and recommendations were discussed                            with the patient. GJustice Britain MD 09/24/2018 10:04:46 AM

## 2018-09-24 NOTE — Patient Instructions (Signed)
HANDOUTS GIVEN FOR POLYPS AND HEMORRHOIDS.   YOU HAD AN ENDOSCOPIC PROCEDURE TODAY AT [facility]:   Refer to the procedure report that was given to you for any specific questions about what was found during the examination.  If the procedure report does not answer your questions, please call your gastroenterologist to clarify.  If you requested that your care partner not be given the details of your procedure findings, then the procedure report has been included in a sealed envelope for you to review at your convenience later.  YOU SHOULD EXPECT: Some feelings of bloating in the abdomen. Passage of more gas than usual.  Walking can help get rid of the air that was put into your GI tract during the procedure and reduce the bloating. If you had a lower endoscopy (such as a colonoscopy or flexible sigmoidoscopy) you may notice spotting of blood in your stool or on the toilet paper. If you underwent a bowel prep for your procedure, you may not have a normal bowel movement for a few days.  Please Note:  You might notice some irritation and congestion in your nose or some drainage.  This is from the oxygen used during your procedure.  There is no need for concern and it should clear up in a day or so.  SYMPTOMS TO REPORT IMMEDIATELY:   Following lower endoscopy (colonoscopy or flexible sigmoidoscopy):  Excessive amounts of blood in the stool  Significant tenderness or worsening of abdominal pains  Swelling of the abdomen that is new, acute  Fever of 100F or higher  For urgent or emergent issues, a gastroenterologist can be reached at any hour by calling [phone].   DIET:  We do recommend a small meal at first, but then you may proceed to your regular diet.  Drink plenty of fluids but you should avoid alcoholic beverages for 24 hours.  ACTIVITY:  You should plan to take it easy for the rest of today and you should NOT DRIVE or use heavy machinery until tomorrow (because of  the sedation medicines used during the test).    FOLLOW UP: Our staff will call the number listed on your records 48-72 hours following your procedure to check on you and address any questions or concerns that you may have regarding the information given to you following your procedure. If we do not reach you, we will leave a message.  We will attempt to reach you two times.  During this call, we will ask if you have developed any symptoms of COVID 19. If you develop any symptoms (ie: fever, flu-like symptoms, shortness of breath, cough etc.) before then, please call [phone].  If you test positive for Covid 19 in the 2 weeks post procedure, please call and report this information to [place].    If any biopsies were taken you will be contacted by phone or by letter within the next 1-3 weeks.  Please call us at [phone] if you have not heard about the biopsies in 3 weeks.    SIGNATURES/CONFIDENTIALITY: You and/or your care partner have signed paperwork which will be entered into your electronic medical record.  These signatures attest to the fact that that the information above on your After Visit Summary has been reviewed and is understood.  Full responsibility of the confidentiality of this discharge information lies with you and/or your care-partner.

## 2018-09-28 ENCOUNTER — Encounter: Payer: Self-pay | Admitting: Gastroenterology

## 2018-09-28 ENCOUNTER — Telehealth: Payer: Self-pay

## 2018-09-28 NOTE — Telephone Encounter (Signed)
10/20/18 at 1:50 pm appt made on 7/13. Pt aware

## 2018-09-28 NOTE — Telephone Encounter (Signed)
-----   Message from Irving Copas., MD sent at 09/28/2018 12:45 PM EDT ----- Regarding: Follow-up Kurt [name],This patient had a poor preparation on his recent colonoscopy but was also found to have a large cecal polyp that will require an EMR.Please schedule the patient and his family for a clinic visit to discuss techniques for advanced polyp resection.Please look into the coming weeks for a 90-minute colonoscopy with EMR time slot as well.Thank you.GM

## 2018-09-28 NOTE — Telephone Encounter (Signed)
  Follow up Call-  Call back number 09/24/2018  Post procedure Call Back phone  # [phone]  Permission to leave phone message Yes  Some recent data might be hidden     Patient questions:  Do you have a fever, pain , or abdominal swelling? No. Pain Score  0 *  Have you tolerated food without any problems? Yes.    Have you been able to return to your normal activities? Yes.    Do you have any questions about your discharge instructions: Diet   No. Medications  No. Follow up visit  No.  Do you have questions or concerns about your Care? No.  Actions: * If pain score is 4 or above: No action needed, pain <4.  1. Have you developed a fever since your procedure? no  2.   Have you had an respiratory symptoms (SOB or cough) since your procedure? no  3.   Have you tested positive for COVID 19 since your procedure no  4.   Have you had any family members/close contacts diagnosed with the COVID 19 since your procedure?  no   If yes to any of these questions please route to Joylene John, RN and Alphonsa Gin, Therapist, sports.

## 2018-09-30 ENCOUNTER — Telehealth: Payer: Self-pay

## 2018-10-04 NOTE — Telephone Encounter (Signed)
Left message with Dr. Rush Landmark about this patient.

## 2018-10-05 NOTE — Telephone Encounter (Signed)
Kurt [name], This patient needs to have a clinic visit to discuss his options in regards to large colonic polyp and need for EMR. Patty should be working on a clinic visit, but I placed her on this as well. We will schedule the follow up colonoscopy, if he agrees in the coming months based on my hospital availability. Thanks. GM

## 2018-10-05 NOTE — Telephone Encounter (Signed)
appt previously scheduled for 8/5.  Pt was notified.

## 2018-10-20 ENCOUNTER — Encounter: Payer: Self-pay | Admitting: Gastroenterology

## 2018-10-20 ENCOUNTER — Ambulatory Visit (INDEPENDENT_AMBULATORY_CARE_PROVIDER_SITE_OTHER): Payer: 59 | Admitting: Gastroenterology

## 2018-10-20 ENCOUNTER — Other Ambulatory Visit (INDEPENDENT_AMBULATORY_CARE_PROVIDER_SITE_OTHER): Payer: 59

## 2018-10-20 VITALS — BP 142/62 | HR 89 | Temp 98.1°F | Ht 68.0 in | Wt 211.0 lb

## 2018-10-20 DIAGNOSIS — R194 Change in bowel habit: Secondary | ICD-10-CM

## 2018-10-20 DIAGNOSIS — Z86010 Personal history of colonic polyps: Secondary | ICD-10-CM

## 2018-10-20 DIAGNOSIS — R152 Fecal urgency: Secondary | ICD-10-CM

## 2018-10-20 DIAGNOSIS — R1031 Right lower quadrant pain: Secondary | ICD-10-CM

## 2018-10-20 DIAGNOSIS — D120 Benign neoplasm of cecum: Secondary | ICD-10-CM

## 2018-10-20 DIAGNOSIS — K635 Polyp of colon: Secondary | ICD-10-CM

## 2018-10-20 LAB — COMPREHENSIVE METABOLIC PANEL
ALT: 36 U/L (ref 0–53)
AST: 18 U/L (ref 0–37)
Albumin: 4.5 g/dL (ref 3.5–5.2)
Alkaline Phosphatase: 132 U/L — ABNORMAL HIGH (ref 39–117)
BUN: 35 mg/dL — ABNORMAL HIGH (ref 6–23)
CO2: 23 mEq/L (ref 19–32)
Calcium: 9.5 mg/dL (ref 8.4–10.5)
Chloride: 103 mEq/L (ref 96–112)
Creatinine, Ser: 1.33 mg/dL (ref 0.40–1.50)
GFR: 54.89 mL/min — ABNORMAL LOW (ref >60.00)
Glucose, Bld: 317 mg/dL — ABNORMAL HIGH (ref 70–99)
Potassium: 4.7 mEq/L (ref 3.5–5.1)
Sodium: 135 mEq/L (ref 135–145)
Total Bilirubin: 0.2 mg/dL (ref 0.2–1.2)
Total Protein: 7.6 g/dL (ref 6.0–8.3)

## 2018-10-20 LAB — CBC
HCT: 40.2 % (ref 39.0–52.0)
Hemoglobin: 13.4 g/dL (ref 13.0–17.0)
MCHC: 33.4 g/dL (ref 30.0–36.0)
MCV: 88.3 fl (ref 78.0–100.0)
Platelets: 322 10*3/uL (ref 150.0–400.0)
RBC: 4.55 Mil/uL (ref 4.22–5.81)
RDW: 13.6 % (ref 11.5–15.5)
WBC: 11.3 10*3/uL — ABNORMAL HIGH (ref 4.0–10.5)

## 2018-10-20 LAB — PROTIME-INR
INR: 0.9 ratio (ref 0.8–1.0)
Prothrombin Time: 11 s (ref 9.6–13.1)

## 2018-10-20 LAB — IGA: IgA: 327 mg/dL (ref 68–378)

## 2018-10-20 LAB — TSH: TSH: 1.13 u[IU]/mL (ref 0.35–4.50)

## 2018-10-20 LAB — HIGH SENSITIVITY CRP: CRP, High Sensitivity: 4.93 mg/L (ref 0.000–5.000)

## 2018-10-20 MED ORDER — PEG 3350-KCL-NA BICARB-NACL 420 G PO SOLR: 4000.0000 mL | Freq: Once | ORAL | 0 refills | Status: AC

## 2018-10-20 NOTE — Patient Instructions (Addendum)
You have been scheduled for a CT scan of the abdomen and pelvis at [facility] ([address]---this is in the same building as [facility]).   You are scheduled on 8/28/2020at 9:30am. You should arrive 15 minutes prior to your appointment time for registration. Please follow the written instructions below on the day of your exam:  WARNING: IF YOU ARE ALLERGIC TO IODINE/X-RAY DYE, PLEASE NOTIFY RADIOLOGY IMMEDIATELY AT [phone]! YOU WILL BE GIVEN A 13 HOUR PREMEDICATION PREP.  1) Do not eat or drink anything after 5:30am (4 hours prior to your test) 2) You have been given 2 bottles of oral contrast to drink. The solution may taste better if refrigerated, but do NOT add ice or any other liquid to this solution. Shake well before drinking.    Drink 1 bottle of contrast @ 7:30am (2 hours prior to your exam)  Drink 1 bottle of contrast @ 8:30am (1 hour prior to your exam)  You may take any medications as prescribed with a small amount of water, if necessary. If you take any of the following medications: METFORMIN, GLUCOPHAGE, GLUCOVANCE, AVANDAMET, RIOMET, FORTAMET, [place] MET, JANUMET, GLUMETZA or METAGLIP, you MAY be asked to HOLD this medication 48 hours AFTER the exam.  The purpose of you drinking the oral contrast is to aid in the visualization of your intestinal tract. The contrast solution may cause some diarrhea. Depending on your individual set of symptoms, you may also receive an intravenous injection of x-ray contrast/dye. Plan on being at [facility] for 30 minutes or longer, depending on the type of exam you are having performed.  This test typically takes 30-45 minutes to complete.  If you have any questions regarding your exam or if you need to reschedule, you may call the CT department at [phone] between the hours of 8:00 am and 5:00 pm, Monday-Friday.  ________________________________________________________________________  Dennis Bast have been  scheduled for a colonoscopy. Please follow written instructions given to you at your visit today.  Please pick up your prep supplies at the pharmacy within the next 1-3 days. If you use inhalers (even only as needed), please bring them with you on the day of your procedure.  Your provider has requested that you go to the basement level for lab work before leaving today. Press "B" on the elevator. The lab is located at the first door on the left as you exit the elevator.   Thank you for choosing me and [facility].  Dr. Rush Landmark

## 2018-10-21 LAB — TISSUE TRANSGLUTAMINASE, IGA: (tTG) Ab, IgA: 1 U/mL

## 2018-10-22 ENCOUNTER — Encounter: Payer: Self-pay | Admitting: Gastroenterology

## 2018-10-22 DIAGNOSIS — R1031 Right lower quadrant pain: Secondary | ICD-10-CM | POA: Insufficient documentation

## 2018-10-22 DIAGNOSIS — Z86010 Personal history of colonic polyps: Secondary | ICD-10-CM | POA: Insufficient documentation

## 2018-10-22 DIAGNOSIS — R152 Fecal urgency: Secondary | ICD-10-CM | POA: Insufficient documentation

## 2018-10-22 DIAGNOSIS — R194 Change in bowel habit: Secondary | ICD-10-CM | POA: Insufficient documentation

## 2018-10-22 DIAGNOSIS — K635 Polyp of colon: Secondary | ICD-10-CM | POA: Insufficient documentation
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
## 2018-10-25 ENCOUNTER — Other Ambulatory Visit: Payer: Self-pay

## 2018-10-25 DIAGNOSIS — R748 Abnormal levels of other serum enzymes: Secondary | ICD-10-CM

## 2018-10-25 NOTE — Progress Notes (Signed)
hpf

## 2018-11-02 ENCOUNTER — Other Ambulatory Visit: Payer: Self-pay

## 2018-11-02 DIAGNOSIS — I6529 Occlusion and stenosis of unspecified carotid artery: Secondary | ICD-10-CM

## 2018-11-09 ENCOUNTER — Encounter (HOSPITAL_COMMUNITY): Payer: 59

## 2018-11-09 ENCOUNTER — Encounter: Payer: 59 | Admitting: Vascular Surgery

## 2018-11-12 ENCOUNTER — Ambulatory Visit (INDEPENDENT_AMBULATORY_CARE_PROVIDER_SITE_OTHER)
Admission: RE | Admit: 2018-11-12 | Discharge: 2018-11-12 | Disposition: A | Discharge status: Home / Self Care | Payer: 59 | Source: Ambulatory Visit | Attending: Gastroenterology | Admitting: Gastroenterology

## 2018-11-12 ENCOUNTER — Other Ambulatory Visit: Payer: Self-pay

## 2018-11-12 DIAGNOSIS — R152 Fecal urgency: Secondary | ICD-10-CM

## 2018-11-12 DIAGNOSIS — R194 Change in bowel habit: Secondary | ICD-10-CM

## 2018-11-12 DIAGNOSIS — R1031 Right lower quadrant pain: Secondary | ICD-10-CM

## 2018-11-12 MED ORDER — IOHEXOL 300 MG/ML  SOLN
100.0000 mL | Freq: Once | INTRAMUSCULAR | Status: AC | PRN
  Administered 2018-11-12: 10:00:00 100 mL via INTRAVENOUS

## 2018-11-18 ENCOUNTER — Encounter (HOSPITAL_COMMUNITY): Payer: Self-pay

## 2018-11-19 ENCOUNTER — Encounter (HOSPITAL_COMMUNITY): Payer: Self-pay | Admitting: *Deleted

## 2018-11-20 ENCOUNTER — Other Ambulatory Visit (HOSPITAL_COMMUNITY)
Admission: RE | Admit: 2018-11-20 | Discharge: 2018-11-20 | Disposition: A | Discharge status: Home / Self Care | Payer: Medicare Other | Source: Ambulatory Visit | Attending: Gastroenterology | Admitting: Gastroenterology

## 2018-11-20 DIAGNOSIS — Z01812 Encounter for preprocedural laboratory examination: Secondary | ICD-10-CM | POA: Insufficient documentation

## 2018-11-20 DIAGNOSIS — Z20828 Contact with and (suspected) exposure to other viral communicable diseases: Secondary | ICD-10-CM | POA: Insufficient documentation

## 2018-11-21 LAB — NOVEL CORONAVIRUS, NAA (HOSP ORDER, SEND-OUT TO REF LAB; TAT 18-24 HRS): SARS-CoV-2, NAA: NOT DETECTED

## 2018-11-24 ENCOUNTER — Encounter (HOSPITAL_COMMUNITY)
Admission: RE | Disposition: A | Discharge status: Home / Self Care | Payer: Self-pay | Source: Home / Self Care | Attending: Gastroenterology

## 2018-11-24 ENCOUNTER — Ambulatory Visit (HOSPITAL_COMMUNITY): Payer: Medicare Other | Admitting: Certified Registered"

## 2018-11-24 ENCOUNTER — Encounter (HOSPITAL_COMMUNITY): Payer: Self-pay

## 2018-11-24 ENCOUNTER — Other Ambulatory Visit: Payer: Self-pay

## 2018-11-24 ENCOUNTER — Ambulatory Visit (HOSPITAL_COMMUNITY)
Admission: RE | Admit: 2018-11-24 | Discharge: 2018-11-24 | Disposition: A | Discharge status: Home / Self Care | Payer: Medicare Other | Attending: Gastroenterology | Admitting: Gastroenterology

## 2018-11-24 DIAGNOSIS — K5730 Diverticulosis of large intestine without perforation or abscess without bleeding: Secondary | ICD-10-CM | POA: Insufficient documentation

## 2018-11-24 DIAGNOSIS — D120 Benign neoplasm of cecum: Secondary | ICD-10-CM | POA: Diagnosis not present

## 2018-11-24 DIAGNOSIS — K635 Polyp of colon: Secondary | ICD-10-CM | POA: Insufficient documentation

## 2018-11-24 DIAGNOSIS — K641 Second degree hemorrhoids: Secondary | ICD-10-CM | POA: Insufficient documentation

## 2018-11-24 DIAGNOSIS — R152 Fecal urgency: Secondary | ICD-10-CM

## 2018-11-24 DIAGNOSIS — K621 Rectal polyp: Secondary | ICD-10-CM

## 2018-11-24 DIAGNOSIS — K644 Residual hemorrhoidal skin tags: Secondary | ICD-10-CM | POA: Diagnosis not present

## 2018-11-24 DIAGNOSIS — I10 Essential (primary) hypertension: Secondary | ICD-10-CM | POA: Insufficient documentation

## 2018-11-24 DIAGNOSIS — E1151 Type 2 diabetes mellitus with diabetic peripheral angiopathy without gangrene: Secondary | ICD-10-CM | POA: Diagnosis not present

## 2018-11-24 DIAGNOSIS — Z794 Long term (current) use of insulin: Secondary | ICD-10-CM | POA: Insufficient documentation

## 2018-11-24 DIAGNOSIS — Z87891 Personal history of nicotine dependence: Secondary | ICD-10-CM | POA: Diagnosis not present

## 2018-11-24 DIAGNOSIS — Q438 Other specified congenital malformations of intestine: Secondary | ICD-10-CM | POA: Insufficient documentation

## 2018-11-24 DIAGNOSIS — R1031 Right lower quadrant pain: Secondary | ICD-10-CM

## 2018-11-24 DIAGNOSIS — R194 Change in bowel habit: Secondary | ICD-10-CM

## 2018-11-24 DIAGNOSIS — Z86010 Personal history of colonic polyps: Secondary | ICD-10-CM

## 2018-11-24 HISTORY — DX: Polyp of colon: K63.5

## 2018-11-24 HISTORY — PX: SUBMUCOSAL LIFTING INJECTION: SHX6855

## 2018-11-24 HISTORY — DX: Peripheral vascular disease, unspecified: I73.9

## 2018-11-24 HISTORY — PX: COLONOSCOPY WITH PROPOFOL: SHX5780

## 2018-11-24 HISTORY — DX: Occlusion and stenosis of unspecified carotid artery: I65.29

## 2018-11-24 HISTORY — PX: ENDOSCOPIC MUCOSAL RESECTION: SHX6839

## 2018-11-24 HISTORY — PX: HEMOSTASIS CLIP PLACEMENT: SHX6857

## 2018-11-24 LAB — GLUCOSE, CAPILLARY: Glucose-Capillary: 184 mg/dL — ABNORMAL HIGH (ref 70–99)

## 2018-11-24 SURGERY — COLONOSCOPY WITH PROPOFOL
Anesthesia: Monitor Anesthesia Care

## 2018-11-24 MED ORDER — PROPOFOL 10 MG/ML IV BOLUS
INTRAVENOUS | Status: AC
  Filled 2018-11-24: qty 20

## 2018-11-24 MED ORDER — PROPOFOL 10 MG/ML IV BOLUS
INTRAVENOUS | Status: AC
  Filled 2018-11-24: qty 60

## 2018-11-24 MED ORDER — LACTATED RINGERS IV SOLN
INTRAVENOUS | Status: AC | PRN
  Administered 2018-11-24: 1000 mL via INTRAVENOUS

## 2018-11-24 MED ORDER — SODIUM CHLORIDE 0.9 % IV SOLN: INTRAVENOUS | Status: DC

## 2018-11-24 MED ORDER — PROPOFOL 500 MG/50ML IV EMUL
INTRAVENOUS | Status: DC | PRN
  Administered 2018-11-24: 125 ug/kg/min via INTRAVENOUS

## 2018-11-24 MED ORDER — PROPOFOL 10 MG/ML IV BOLUS
INTRAVENOUS | Status: DC | PRN
  Administered 2018-11-24: 20 mg via INTRAVENOUS

## 2018-11-24 SURGICAL SUPPLY — 22 items

## 2018-11-24 NOTE — Anesthesia Preprocedure Evaluation (Addendum)
Anesthesia Evaluation  Patient identified by MRN, date of birth, ID band Patient awake    Reviewed: Allergy & Precautions, NPO status , Patient's Chart, lab work & pertinent test results  History of Anesthesia Complications Negative for: history of anesthetic complications  Airway Mallampati: II  TM Distance: >3 FB Neck ROM: Full    Dental   Pulmonary neg pulmonary ROS, former smoker,    Pulmonary exam normal        Cardiovascular hypertension, Pt. on medications + Peripheral Vascular Disease  Normal cardiovascular exam     Neuro/Psych Moderate to severe bilateral carotid stenosis negative psych ROS   GI/Hepatic Neg liver ROS, PUD,   Endo/Other  diabetes, Insulin Dependent, Oral Hypoglycemic Agents  Renal/GU negative Renal ROS  negative genitourinary   Musculoskeletal negative musculoskeletal ROS (+)   Abdominal   Peds  Hematology negative hematology ROS (+)   Anesthesia Other Findings   Reproductive/Obstetrics                            Anesthesia Physical Anesthesia Plan  ASA: III  Anesthesia Plan: MAC   Post-op Pain Management:    Induction: Intravenous  PONV Risk Score and Plan: 1 and Propofol infusion, TIVA and Treatment may vary due to age or medical condition  Airway Management Planned: Natural Airway, Simple Face Mask and Nasal Cannula  Additional Equipment: None  Intra-op Plan:   Post-operative Plan:   Informed Consent: I have reviewed the patients History and Physical, chart, labs and discussed the procedure including the risks, benefits and alternatives for the proposed anesthesia with the patient or authorized representative who has indicated his/her understanding and acceptance.       Plan Discussed with:   Anesthesia Plan Comments:        Anesthesia Quick Evaluation

## 2018-11-24 NOTE — Anesthesia Postprocedure Evaluation (Signed)
Anesthesia Post Note  Patient: ELAM ELLIS  Procedure(s) Performed: COLONOSCOPY WITH PROPOFOL (N/A ) ENDOSCOPIC MUCOSAL RESECTION (N/A ) SUBMUCOSAL LIFTING INJECTION HEMOSTASIS CLIP PLACEMENT     Patient location during evaluation: Endoscopy Anesthesia Type: MAC Level of consciousness: awake and alert Pain management: pain level controlled Vital Signs Assessment: post-procedure vital signs reviewed and stable Respiratory status: spontaneous breathing, nonlabored ventilation and respiratory function stable Cardiovascular status: blood pressure returned to baseline and stable Postop Assessment: no apparent nausea or vomiting Anesthetic complications: no    Last Vitals:  Vitals:   11/24/18 0957 11/24/18 1000  BP: (!) 125/45 (!) 124/58  Pulse: 74 73  Resp: 20 18  Temp: 37.3 C   SpO2: 100% 100%    Last Pain:  Vitals:   11/24/18 0957  TempSrc: Temporal  PainSc: 5                  Lidia Collum

## 2018-11-24 NOTE — Transfer of Care (Signed)
Immediate Anesthesia Transfer of Care Note  Patient: OLIN GURSKI  Procedure(s) Performed: COLONOSCOPY WITH PROPOFOL (N/A ) ENDOSCOPIC MUCOSAL RESECTION (N/A ) SUBMUCOSAL LIFTING INJECTION HEMOSTASIS CLIP PLACEMENT  Patient Location: PACU and Endoscopy Unit  Anesthesia Type:MAC  Level of Consciousness: awake, alert  and oriented  Airway & Oxygen Therapy: Patient Spontanous Breathing and Patient connected to face mask oxygen  Post-op Assessment: Report given to RN and Post -op Vital signs reviewed and stable  Post vital signs: Reviewed and stable  Last Vitals:  Vitals Value Taken Time  BP    Temp    Pulse    Resp    SpO2      Last Pain:  Vitals:   11/24/18 0812  TempSrc: Oral  PainSc: 7          Complications: No apparent anesthesia complications

## 2018-11-24 NOTE — Anesthesia Procedure Notes (Signed)
Procedure Name: MAC Date/Time: 11/24/2018 8:44 AM Performed by: Eben Burow, CRNA Pre-anesthesia Checklist: Patient identified, Emergency Drugs available, Suction available, Patient being monitored and Timeout performed Oxygen Delivery Method: Simple face mask Dental Injury: Teeth and Oropharynx as per pre-operative assessment
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
## 2018-11-24 NOTE — Op Note (Signed)
[facility] Patient Name: Kurt [name] Procedure Date: 11/24/2018 MRN: [id number] Attending MD: Justice Britain , MD Date of Birth: 08-18-1960 CSN: [id number] Age: 58 Admit Type: Outpatient Procedure:                Colonoscopy Indications:              Excision of colonic polyp, Previous inadequate                            preparation Providers:                Justice Britain, MD, Carlyn Reichert, RN, Elspeth Cho Tech., Technician, Lazaro Arms,                            Technician, Jefm Miles, CRNA Referring MD:             Halford Chessman MD, MD Medicines:                Monitored Anesthesia Care Complications:            No immediate complications. Estimated Blood Loss:     Estimated blood loss was minimal. Procedure:                Pre-Anesthesia Assessment:                           - Prior to the procedure, a History and Physical                            was performed, and patient medications and                            allergies were reviewed. The patient's tolerance of                            previous anesthesia was also reviewed. The risks                            and benefits of the procedure and the sedation                            options and risks were discussed with the patient.                            All questions were answered, and informed consent                            was obtained. Prior Anticoagulants: The patient has                            taken no previous anticoagulant or antiplatelet                            agents except  for aspirin. ASA Grade Assessment: II                            - A patient with mild systemic disease. After                            reviewing the risks and benefits, the patient was                            deemed in satisfactory condition to undergo the                            procedure.                           After obtaining informed consent,  the colonoscope                            was passed under direct vision. Throughout the                            procedure, the patient's blood pressure, pulse, and                            oxygen saturations were monitored continuously. The                            CF-HQ190L ([id number]) Olympus colonoscope was                            introduced through the anus and advanced to the the                            cecum, identified by appendiceal orifice and                            ileocecal valve. The colonoscopy was somewhat                            difficult due to poor endoscopic visualization, a                            redundant colon and significant looping. Successful                            completion of the procedure was aided by changing                            the patient's position, using manual pressure,                            withdrawing and reinserting the scope,  straightening and shortening the scope to obtain                            bowel loop reduction and using scope torsion. The                            quality of the bowel preparation was adequate. The                            ileocecal valve, appendiceal orifice, and rectum                            were photographed. Scope In: 8:54:49 AM Scope Out: 9:45:42 AM Scope Withdrawal Time: 0 hours 42 minutes 43 seconds  Total Procedure Duration: 0 hours 50 minutes 53 seconds  Findings:      Hemorrhoids were found on perianal exam.      A large amount of semi-liquid stool was found in the entire colon,       interfering with visualization. Lavage of the area was performed using       copious amounts, resulting in clearance with adequate visualization.      A 25 mm polyp was found in the cecum. The polyp was sessile.       Preparations were made for mucosal resection. Orise gel was injected to       raise the lesion. Piecemeal mucosal resection using a snare was        performed. Resection and retrieval were complete. To close the defect       after mucosal resection, seven hemostatic clips were successfully placed       (MR conditional). There was no bleeding at the end of the procedure.      13 sessile polyps were found in the rectum (3), recto-sigmoid colon (1),       descending colon (4), transverse colon (2), hepatic flexure (2) and       ascending colon (1). The polyps were 1 to 6 mm in size. These polyps       were removed with a cold snare. Resection and retrieval were complete.      Multiple small-mouthed diverticula were found in the recto-sigmoid colon       and sigmoid colon.      Non-bleeding non-thrombosed external and internal hemorrhoids were found       during retroflexion, during perianal exam and during digital exam. The       hemorrhoids were Grade II (internal hemorrhoids that prolapse but reduce       spontaneously). Impression:               - Hemorrhoids found on perianal exam.                           - Stool in the entire examined colon.                           - One 25 mm polyp in the cecum, removed with                            piecemeal cold mucosal resection. Resected and  retrieved. Clips (MR conditional) were placed.                           - 13, 1 to 6 mm polyps in the rectum, at the                            recto-sigmoid colon, in the descending colon, in                            the transverse colon, at the hepatic flexure and in                            the ascending colon, removed with a cold snare.                            Resected and retrieved.                           - Diverticulosis in the recto-sigmoid colon and in                            the sigmoid colon.                           - Non-bleeding non-thrombosed external and internal                            hemorrhoids. Moderate Sedation:      Not Applicable - Patient had care per Anesthesia. Recommendation:            - The patient will be observed post-procedure,                            until all discharge criteria are met.                           - Discharge patient to home.                           - Patient has a contact number available for                            emergencies. The signs and symptoms of potential                            delayed complications were discussed with the                            patient. Return to normal activities tomorrow.                            Written discharge instructions were provided to the                            patient.                           -  High fiber diet.                           - Continue present medications.                           - Await pathology results.                           - Repeat colonoscopy in 6 months for surveillance                            after piecemeal mucosal resection.                           - The findings and recommendations were discussed                            with the patient.                           - The findings and recommendations were discussed                            with the patient's family. Procedure Code(s):        --- Professional ---                           [phone], Colonoscopy, flexible; with endoscopic                            mucosal resection                           45385, 36, Colonoscopy, flexible; with removal of                            tumor(s), polyp(s), or other lesion(s) by snare                            technique Diagnosis Code(s):        --- Professional ---                           K64.1, Second degree hemorrhoids                           K63.5, Polyp of colon                           K62.1, Rectal polyp                           K57.30, Diverticulosis of large intestine without                            perforation or abscess without bleeding CPT copyright 2019 American Medical Association. All rights reserved. The codes documented in  this report are preliminary and upon coder review may  be revised to meet current compliance requirements. Justice Britain, MD 11/24/2018 10:08:08 AM Number of Addenda: 0

## 2018-11-24 NOTE — Discharge Instructions (Signed)

## 2018-11-25 ENCOUNTER — Encounter (HOSPITAL_COMMUNITY): Payer: Self-pay | Admitting: Gastroenterology

## 2018-11-26 ENCOUNTER — Encounter: Payer: Self-pay | Admitting: Gastroenterology

## 2018-11-26 ENCOUNTER — Telehealth: Payer: Self-pay

## 2018-11-26 NOTE — Telephone Encounter (Signed)
Per Dr Rush Landmark he would like to know how the pt is doing.

## 2018-11-26 NOTE — Telephone Encounter (Signed)
The pt was called and he states he is doing very well.  No current issues.

## 2019-03-24 DIAGNOSIS — E1165 Type 2 diabetes mellitus with hyperglycemia: Secondary | ICD-10-CM | POA: Diagnosis not present

## 2019-03-24 DIAGNOSIS — E1140 Type 2 diabetes mellitus with diabetic neuropathy, unspecified: Secondary | ICD-10-CM | POA: Diagnosis not present

## 2019-05-26 ENCOUNTER — Other Ambulatory Visit: Payer: Self-pay

## 2019-05-26 NOTE — Patient Outreach (Signed)
[facility]) Care Management  05/26/2019  Kurt [name] 1960-08-02 [id number]  Medication Adherence call to Mr. Kurt [name] patients telephone number is disconnected. Patient is showing past due on Atorvastatin 40 mg under [place].  [facility] Management Direct Dial [phone]  Fax [phone] [email or web address]

## 2019-07-11 DIAGNOSIS — M25552 Pain in left hip: Secondary | ICD-10-CM | POA: Diagnosis not present

## 2019-07-11 DIAGNOSIS — M25551 Pain in right hip: Secondary | ICD-10-CM | POA: Diagnosis not present

## 2019-07-12 DIAGNOSIS — M25551 Pain in right hip: Secondary | ICD-10-CM | POA: Diagnosis not present

## 2019-08-03 DIAGNOSIS — M25551 Pain in right hip: Secondary | ICD-10-CM | POA: Diagnosis not present

## 2019-08-08 DIAGNOSIS — M1611 Unilateral primary osteoarthritis, right hip: Secondary | ICD-10-CM | POA: Diagnosis not present

## 2019-08-08 DIAGNOSIS — M7061 Trochanteric bursitis, right hip: Secondary | ICD-10-CM | POA: Diagnosis not present

## 2019-08-08 DIAGNOSIS — M25551 Pain in right hip: Secondary | ICD-10-CM | POA: Diagnosis not present

## 2019-09-28 DIAGNOSIS — M7061 Trochanteric bursitis, right hip: Secondary | ICD-10-CM | POA: Diagnosis not present

## 2019-09-28 DIAGNOSIS — M25551 Pain in right hip: Secondary | ICD-10-CM | POA: Diagnosis not present

## 2019-09-29 DIAGNOSIS — E1165 Type 2 diabetes mellitus with hyperglycemia: Secondary | ICD-10-CM | POA: Diagnosis not present

## 2019-09-29 DIAGNOSIS — I739 Peripheral vascular disease, unspecified: Secondary | ICD-10-CM | POA: Diagnosis not present

## 2019-09-29 DIAGNOSIS — E7849 Other hyperlipidemia: Secondary | ICD-10-CM | POA: Diagnosis not present

## 2019-09-29 DIAGNOSIS — Z0001 Encounter for general adult medical examination with abnormal findings: Secondary | ICD-10-CM | POA: Diagnosis not present

## 2019-09-29 DIAGNOSIS — R55 Syncope and collapse: Secondary | ICD-10-CM | POA: Diagnosis not present

## 2019-09-29 DIAGNOSIS — E1121 Type 2 diabetes mellitus with diabetic nephropathy: Secondary | ICD-10-CM | POA: Diagnosis not present

## 2019-09-29 DIAGNOSIS — I10 Essential (primary) hypertension: Secondary | ICD-10-CM | POA: Diagnosis not present

## 2019-09-29 DIAGNOSIS — E538 Deficiency of other specified B group vitamins: Secondary | ICD-10-CM | POA: Diagnosis not present

## 2019-09-29 DIAGNOSIS — D649 Anemia, unspecified: Secondary | ICD-10-CM | POA: Diagnosis not present

## 2019-09-29 DIAGNOSIS — Z1389 Encounter for screening for other disorder: Secondary | ICD-10-CM | POA: Diagnosis not present

## 2019-09-29 DIAGNOSIS — E1140 Type 2 diabetes mellitus with diabetic neuropathy, unspecified: Secondary | ICD-10-CM | POA: Diagnosis not present

## 2019-11-10 ENCOUNTER — Ambulatory Visit: Payer: 59 | Admitting: "Endocrinology

## 2019-11-15 DIAGNOSIS — Z720 Tobacco use: Secondary | ICD-10-CM | POA: Diagnosis not present

## 2019-11-15 DIAGNOSIS — E1121 Type 2 diabetes mellitus with diabetic nephropathy: Secondary | ICD-10-CM | POA: Diagnosis not present

## 2019-11-15 DIAGNOSIS — I700 Atherosclerosis of aorta: Secondary | ICD-10-CM | POA: Diagnosis not present

## 2019-11-15 DIAGNOSIS — I10 Essential (primary) hypertension: Secondary | ICD-10-CM | POA: Diagnosis not present

## 2019-11-18 ENCOUNTER — Other Ambulatory Visit: Payer: Self-pay

## 2019-11-18 DIAGNOSIS — I6529 Occlusion and stenosis of unspecified carotid artery: Secondary | ICD-10-CM

## 2019-12-02 ENCOUNTER — Encounter: Payer: Self-pay | Admitting: Vascular Surgery

## 2019-12-02 ENCOUNTER — Ambulatory Visit (HOSPITAL_COMMUNITY)
Admission: RE | Admit: 2019-12-02 | Discharge: 2019-12-02 | Disposition: A | Discharge status: Home / Self Care | Payer: Medicare Other | Source: Ambulatory Visit | Attending: Vascular Surgery | Admitting: Vascular Surgery

## 2019-12-02 ENCOUNTER — Ambulatory Visit (INDEPENDENT_AMBULATORY_CARE_PROVIDER_SITE_OTHER): Payer: Medicare Other | Admitting: Vascular Surgery

## 2019-12-02 ENCOUNTER — Other Ambulatory Visit: Payer: Self-pay

## 2019-12-02 VITALS — BP 144/83 | HR 94 | Temp 98.2°F | Resp 20 | Ht 68.0 in | Wt 205.0 lb

## 2019-12-02 DIAGNOSIS — I6529 Occlusion and stenosis of unspecified carotid artery: Secondary | ICD-10-CM
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
## 2019-12-15 DIAGNOSIS — E7849 Other hyperlipidemia: Secondary | ICD-10-CM | POA: Diagnosis not present

## 2019-12-15 DIAGNOSIS — E1165 Type 2 diabetes mellitus with hyperglycemia: Secondary | ICD-10-CM | POA: Diagnosis not present

## 2019-12-15 DIAGNOSIS — E1121 Type 2 diabetes mellitus with diabetic nephropathy: Secondary | ICD-10-CM | POA: Diagnosis not present

## 2019-12-15 DIAGNOSIS — I10 Essential (primary) hypertension: Secondary | ICD-10-CM | POA: Diagnosis not present

## 2020-01-18 DIAGNOSIS — M4326 Fusion of spine, lumbar region: Secondary | ICD-10-CM | POA: Diagnosis not present

## 2020-01-18 DIAGNOSIS — G894 Chronic pain syndrome: Secondary | ICD-10-CM | POA: Diagnosis not present

## 2020-03-12 IMAGING — CT CT ABDOMEN AND PELVIS WITH CONTRAST
2 of 5 series · 16 of 46 positions shown, 18 images · IV contrast (OMNIPAQUE 300)
Comparison: None.

CLINICAL DATA: Right lower quadrant abdominal pain. Fecal urgency.
Change in bowel habits. Prior cholecystectomy.

EXAM:
CT ABDOMEN AND PELVIS WITH CONTRAST
TECHNIQUE: Multidetector CT imaging of the abdomen and pelvis was performed
using the standard protocol following bolus administration of
intravenous contrast.
CONTRAST:  100mL OMNIPAQUE IOHEXOL 300 MG/ML  SOLN

[Series 2: abd/pel w · axial · 0.82mm/px · z∈[-502,-62]mm · 13 of 98 slices shown, 15 images]
[im 5/98  soft-tissue]
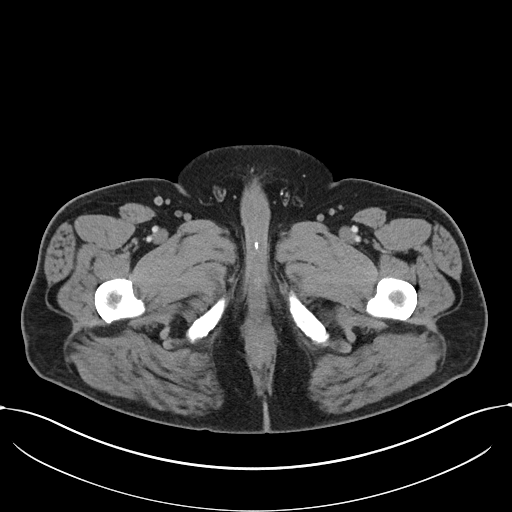
[im 5/98  bone]
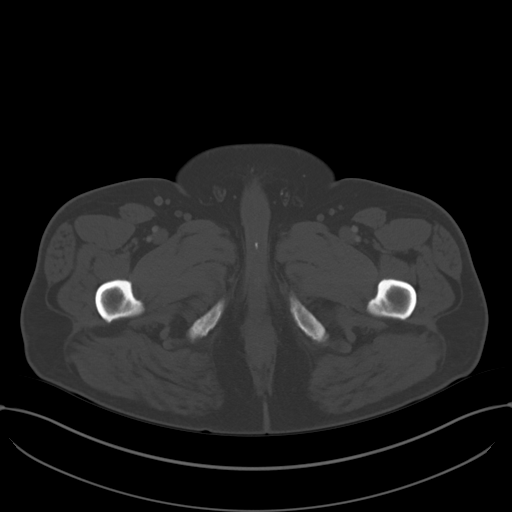
[im 15/98  soft-tissue]
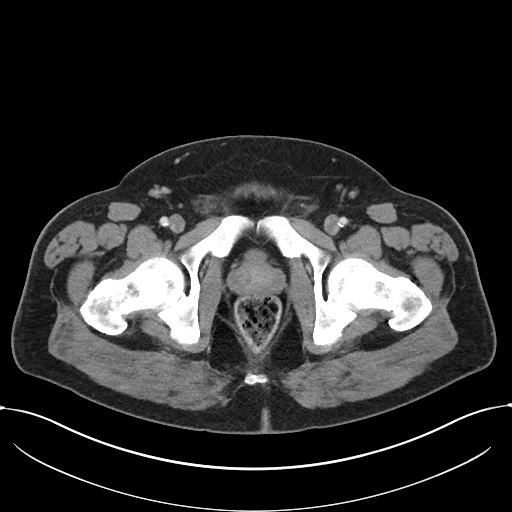
[im 20/98  soft-tissue]
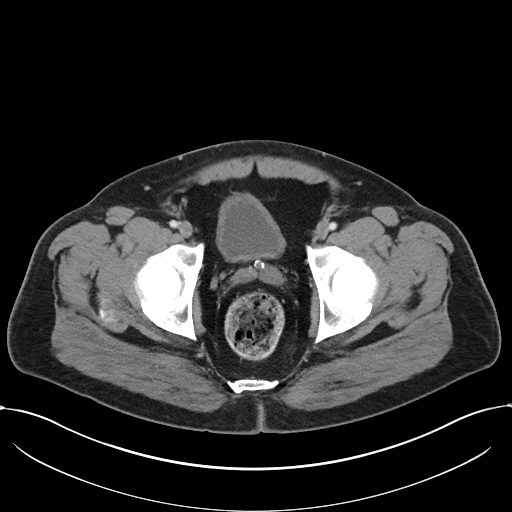
[im 30/98  soft-tissue]
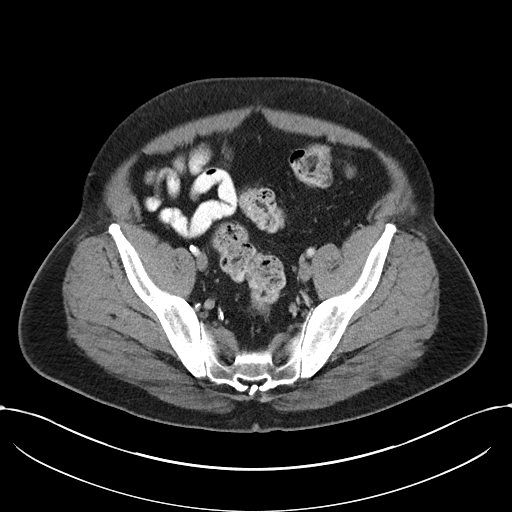
[im 34/98  soft-tissue]
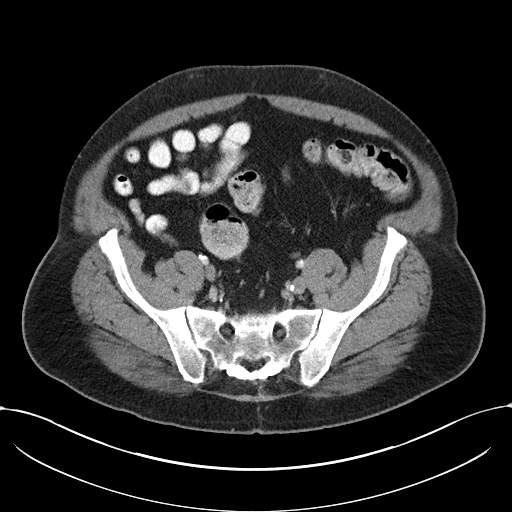
[im 44/98  soft-tissue]
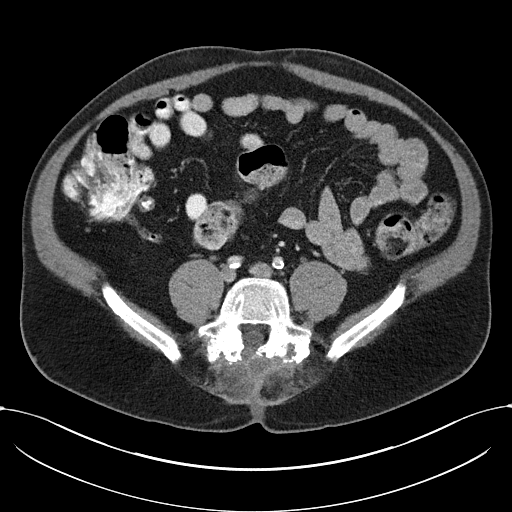
[im 49/98  soft-tissue]
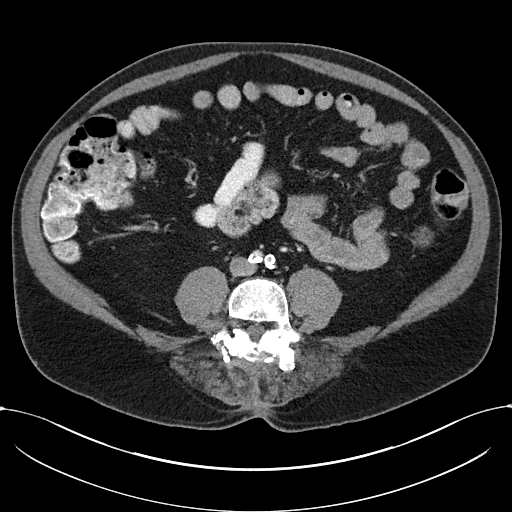
[im 54/98  soft-tissue]
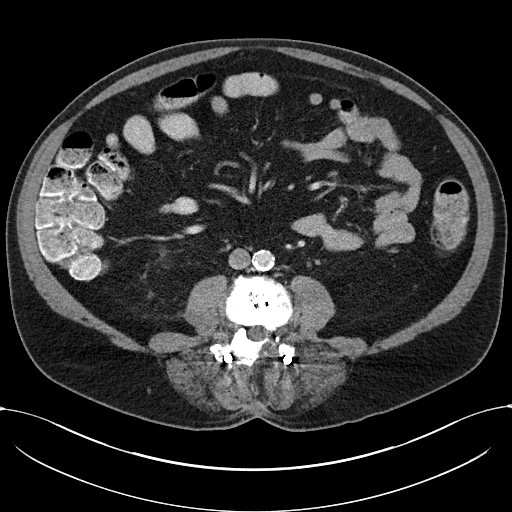
[im 64/98  soft-tissue]
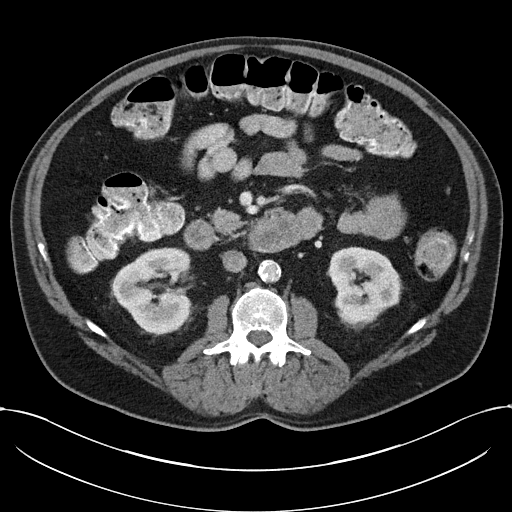
[im 64/98  bone]
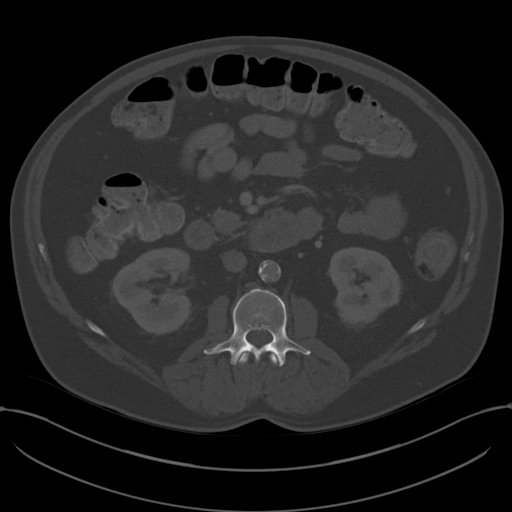
[im 68/98  soft-tissue]
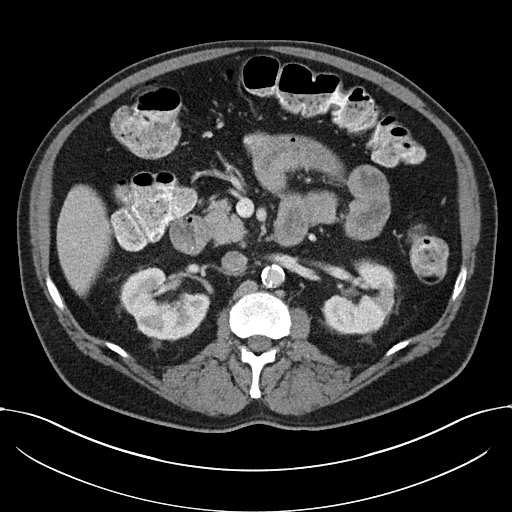
[im 78/98  soft-tissue]
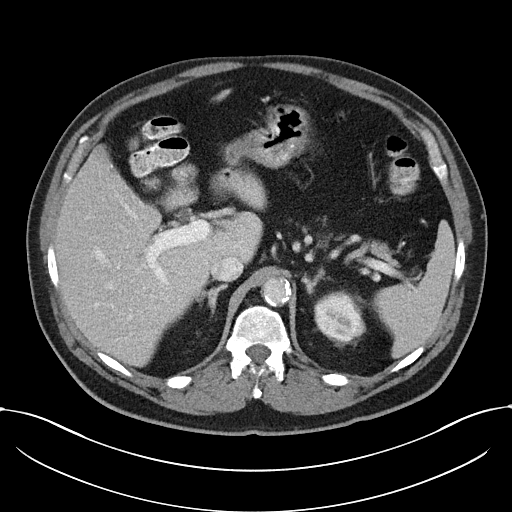
[im 83/98  soft-tissue]
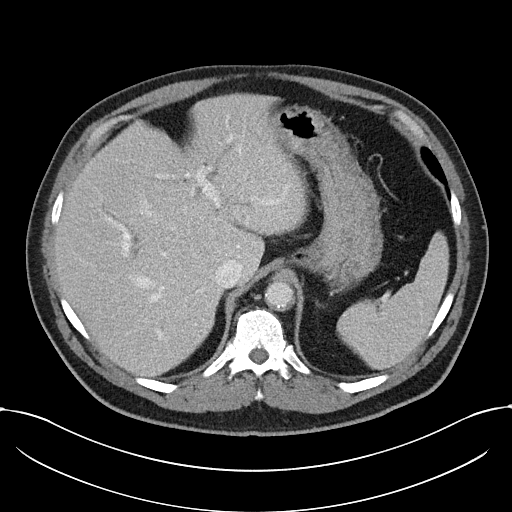
[im 93/98  soft-tissue]
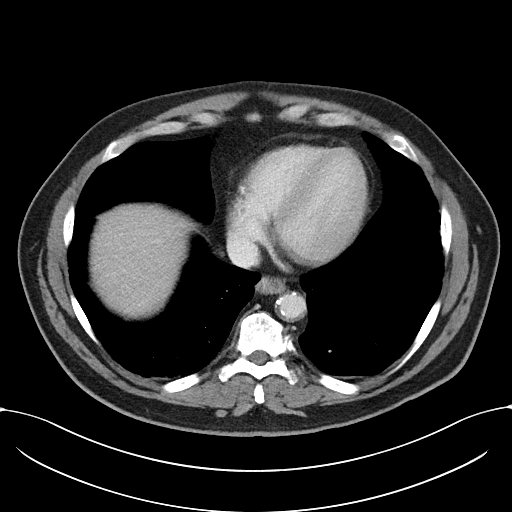

[Series 6: abd/pel w st · coronal · 0.80mm/px · 3 of 111 slices shown]
[im 37/111  soft-tissue]
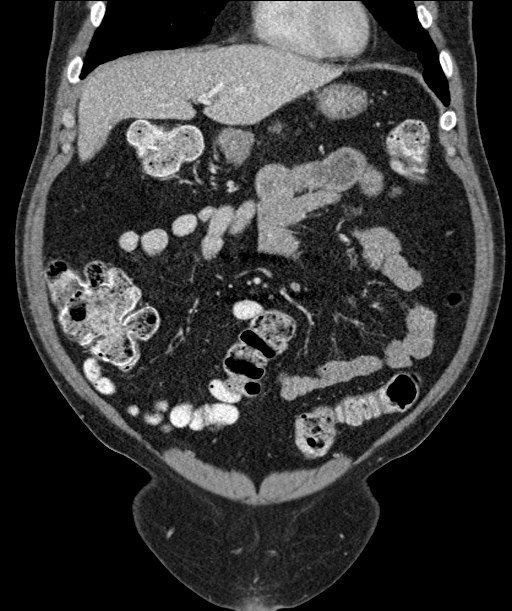
[im 49/111  soft-tissue]
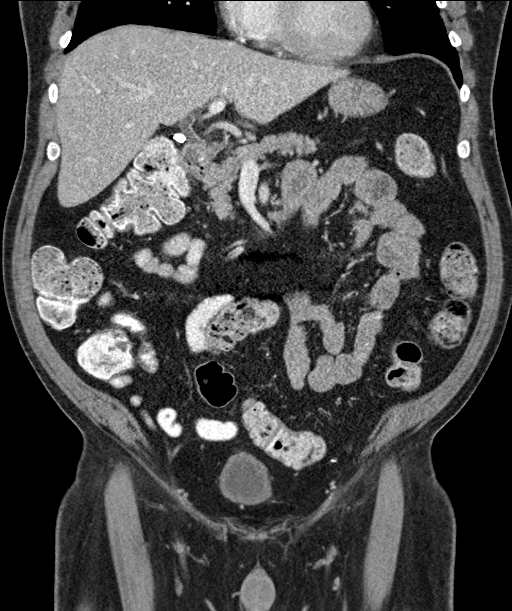
[im 62/111  soft-tissue]
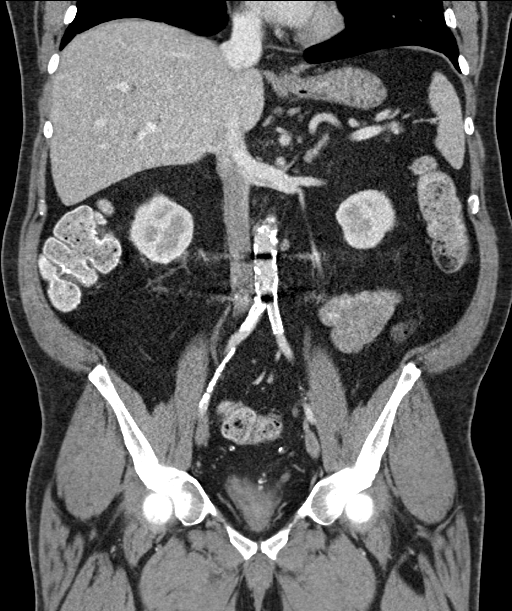

[16 of 46 positions shown; findings below may reference images not displayed]

FINDINGS: Lower chest: Nonspecific patchy subpleural reticulation at both lung
bases. No acute abnormality at the lung bases. Right coronary
atherosclerosis.

Hepatobiliary: Normal liver size. No liver mass. Cholecystectomy.
Bile ducts are within normal post cholecystectomy limits with CBD
diameter 6 mm.

Pancreas: Normal, with no mass or duct dilation.

Spleen: Normal size. No mass.

Adrenals/Urinary Tract: Normal adrenals. No hydronephrosis.
Scattered mild renal cortical scarring throughout both kidneys. No
renal masses. Normal bladder.

Stomach/Bowel: Normal non-distended stomach. Normal caliber small
bowel with no small bowel wall thickening. Normal appendix. Oral
contrast is seen within the lumen of the appendix. Oral contrast
transits to left colon. Normal large bowel with no diverticulosis,
large bowel wall thickening or pericolonic fat stranding.

Vascular/Lymphatic: Atherosclerotic nonaneurysmal abdominal aorta.
Patent portal, splenic, hepatic and renal veins. No pathologically
enlarged lymph nodes in the abdomen or pelvis.

Reproductive: Top-normal size prostate.

Other: No pneumoperitoneum, ascites or focal fluid collection.

Musculoskeletal: No aggressive appearing focal osseous lesions.
Status post bilateral posterior spinal fusion L3-4. Moderate to
marked lower lumbar spondylosis.
IMPRESSION: 1. No acute abnormality. No evidence of bowel obstruction or acute
bowel inflammation. Normal appendix.
2. Nonspecific patchy subpleural reticulation at the lung bases. If
there is clinical concern for interstitial lung disease, a dedicated
high-resolution chest CT study may be obtained for further
evaluation.
3.  Aortic Atherosclerosis ([id number].S).

## 2020-04-28 ENCOUNTER — Emergency Department (HOSPITAL_COMMUNITY)
Admission: EM | Admit: 2020-04-28 | Discharge: 2020-04-28 | Disposition: A | Discharge status: Home / Self Care | Payer: Medicare Other | Attending: Emergency Medicine | Admitting: Emergency Medicine

## 2020-04-28 ENCOUNTER — Encounter (HOSPITAL_COMMUNITY): Payer: Self-pay | Admitting: Emergency Medicine

## 2020-04-28 ENCOUNTER — Other Ambulatory Visit: Payer: Self-pay

## 2020-04-28 DIAGNOSIS — Z87891 Personal history of nicotine dependence: Secondary | ICD-10-CM | POA: Insufficient documentation

## 2020-04-28 DIAGNOSIS — Z23 Encounter for immunization: Secondary | ICD-10-CM | POA: Diagnosis not present

## 2020-04-28 DIAGNOSIS — S00502A Unspecified superficial injury of oral cavity, initial encounter: Secondary | ICD-10-CM | POA: Diagnosis present

## 2020-04-28 DIAGNOSIS — I10 Essential (primary) hypertension: Secondary | ICD-10-CM | POA: Diagnosis not present

## 2020-04-28 DIAGNOSIS — Z79899 Other long term (current) drug therapy: Secondary | ICD-10-CM | POA: Diagnosis not present

## 2020-04-28 DIAGNOSIS — E119 Type 2 diabetes mellitus without complications: Secondary | ICD-10-CM | POA: Diagnosis not present

## 2020-04-28 DIAGNOSIS — Z7984 Long term (current) use of oral hypoglycemic drugs: Secondary | ICD-10-CM | POA: Diagnosis not present

## 2020-04-28 DIAGNOSIS — S01512A Laceration without foreign body of oral cavity, initial encounter: Secondary | ICD-10-CM | POA: Diagnosis not present

## 2020-04-28 DIAGNOSIS — Z7982 Long term (current) use of aspirin: Secondary | ICD-10-CM | POA: Insufficient documentation

## 2020-04-28 MED ORDER — LIDOCAINE-EPINEPHRINE (PF) 2 %-1:200000 IJ SOLN: 10.0000 mL | Freq: Once | INTRAMUSCULAR | Status: DC

## 2020-04-28 MED ORDER — TETANUS-DIPHTH-ACELL PERTUSSIS 5-2.5-18.5 LF-MCG/0.5 IM SUSY
0.5000 mL | PREFILLED_SYRINGE | Freq: Once | INTRAMUSCULAR | Status: AC
  Administered 2020-04-28: 0.5 mL via INTRAMUSCULAR
  Filled 2020-04-28: qty 0.5

## 2020-04-28 MED ORDER — LIDOCAINE HCL (PF) 2 % IJ SOLN
INTRAMUSCULAR | Status: AC
  Administered 2020-04-28: 10 mL
  Filled 2020-04-28: qty 20

## 2020-04-28 MED ORDER — PENICILLIN V POTASSIUM 250 MG PO TABS: 250.0000 mg | ORAL_TABLET | Freq: Three times a day (TID) | ORAL | 0 refills | Status: AC
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
## 2020-04-28 NOTE — Discharge Instructions (Addendum)
Sutured repair Keep the laceration site dry for the next 24 hours and leave the dressing in place. After 24 hours you may remove the dressing and gently clean the laceration  by swishing water in your mouth. Do not scrub the area.Dont use hydrogen peroxide, iodine-based solutions, or alcohol, which can slow healing, and will probably be painful! These sutures will dissolve with time.  You should return to ER or go to primary care MD for any signs of infection which would include increased redness around the wound, increased swelling, new drainage of yellow pus.

## 2020-05-09 ENCOUNTER — Other Ambulatory Visit (HOSPITAL_COMMUNITY): Payer: Self-pay | Admitting: Internal Medicine

## 2020-05-09 ENCOUNTER — Other Ambulatory Visit: Payer: Self-pay | Admitting: Internal Medicine

## 2020-05-09 DIAGNOSIS — H539 Unspecified visual disturbance: Secondary | ICD-10-CM

## 2020-05-09 DIAGNOSIS — E1121 Type 2 diabetes mellitus with diabetic nephropathy: Secondary | ICD-10-CM | POA: Diagnosis not present

## 2020-05-09 DIAGNOSIS — I700 Atherosclerosis of aorta: Secondary | ICD-10-CM | POA: Diagnosis not present

## 2020-05-09 DIAGNOSIS — G44009 Cluster headache syndrome, unspecified, not intractable: Secondary | ICD-10-CM | POA: Diagnosis not present

## 2020-05-09 DIAGNOSIS — E1140 Type 2 diabetes mellitus with diabetic neuropathy, unspecified: Secondary | ICD-10-CM | POA: Diagnosis not present

## 2020-05-09 DIAGNOSIS — E7849 Other hyperlipidemia: Secondary | ICD-10-CM | POA: Diagnosis not present

## 2020-05-09 DIAGNOSIS — E1165 Type 2 diabetes mellitus with hyperglycemia: Secondary | ICD-10-CM | POA: Diagnosis not present

## 2020-05-09 DIAGNOSIS — I10 Essential (primary) hypertension: Secondary | ICD-10-CM | POA: Diagnosis not present

## 2020-05-09 DIAGNOSIS — R201 Hypoesthesia of skin: Secondary | ICD-10-CM | POA: Diagnosis not present

## 2020-05-09 DIAGNOSIS — R519 Headache, unspecified: Secondary | ICD-10-CM

## 2020-05-22 ENCOUNTER — Ambulatory Visit (HOSPITAL_COMMUNITY)
Admission: RE | Admit: 2020-05-22 | Discharge: 2020-05-22 | Disposition: A | Discharge status: Home / Self Care | Payer: Medicare Other | Source: Ambulatory Visit | Attending: Internal Medicine | Admitting: Internal Medicine

## 2020-05-22 ENCOUNTER — Other Ambulatory Visit: Payer: Self-pay

## 2020-05-22 DIAGNOSIS — R519 Headache, unspecified: Secondary | ICD-10-CM | POA: Insufficient documentation

## 2020-05-22 DIAGNOSIS — H539 Unspecified visual disturbance: Secondary | ICD-10-CM | POA: Insufficient documentation

## 2020-05-22 DIAGNOSIS — J3489 Other specified disorders of nose and nasal sinuses: Secondary | ICD-10-CM | POA: Diagnosis not present

## 2020-05-22 DIAGNOSIS — Z9889 Other specified postprocedural states: Secondary | ICD-10-CM | POA: Diagnosis not present

## 2020-06-13 ENCOUNTER — Telehealth: Payer: Self-pay | Admitting: Surgery

## 2020-06-13 DIAGNOSIS — I10 Essential (primary) hypertension: Secondary | ICD-10-CM | POA: Diagnosis not present

## 2020-06-13 DIAGNOSIS — E7800 Pure hypercholesterolemia, unspecified: Secondary | ICD-10-CM | POA: Diagnosis not present

## 2020-06-13 DIAGNOSIS — E1140 Type 2 diabetes mellitus with diabetic neuropathy, unspecified: Secondary | ICD-10-CM | POA: Diagnosis not present

## 2020-06-13 DIAGNOSIS — I63239 Cerebral infarction due to unspecified occlusion or stenosis of unspecified carotid arteries: Secondary | ICD-10-CM | POA: Diagnosis not present

## 2020-06-13 DIAGNOSIS — I739 Peripheral vascular disease, unspecified: Secondary | ICD-10-CM | POA: Diagnosis not present

## 2020-06-13 NOTE — Telephone Encounter (Signed)
I spoke with Dr.[place]oonquah about the patient this evening.  He has a history of TIA however has been having more frequent episodes this past month.  His last episode was 1 week ago.  He had a MRI today that showed a small left brain stroke.  He had been on aspirin but was started on Plavix today.  He has previously seen Dr. Donzetta Matters.  I will have him come to the office within the next day or 2 for discussions on carotid revascularization  Wells Keyana Guevara

## 2020-06-14 ENCOUNTER — Other Ambulatory Visit: Payer: Self-pay

## 2020-06-14 DIAGNOSIS — I6529 Occlusion and stenosis of unspecified carotid artery: Secondary | ICD-10-CM

## 2020-06-15 ENCOUNTER — Other Ambulatory Visit: Payer: Self-pay

## 2020-06-15 ENCOUNTER — Ambulatory Visit: Payer: Medicare Other | Admitting: Vascular Surgery

## 2020-06-15 ENCOUNTER — Encounter: Payer: Self-pay | Admitting: Vascular Surgery

## 2020-06-15 ENCOUNTER — Ambulatory Visit (HOSPITAL_COMMUNITY)
Admission: RE | Admit: 2020-06-15 | Discharge: 2020-06-15 | Disposition: A | Discharge status: Home / Self Care | Payer: Medicare Other | Source: Ambulatory Visit | Attending: Vascular Surgery | Admitting: Vascular Surgery

## 2020-06-15 VITALS — BP 151/81 | HR 93 | Temp 98.1°F | Resp 20 | Ht 68.0 in | Wt 204.0 lb

## 2020-06-15 DIAGNOSIS — I6529 Occlusion and stenosis of unspecified carotid artery: Secondary | ICD-10-CM
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
## 2020-06-18 ENCOUNTER — Encounter (HOSPITAL_COMMUNITY): Payer: Self-pay | Admitting: Vascular Surgery

## 2020-06-18 ENCOUNTER — Other Ambulatory Visit (HOSPITAL_COMMUNITY)
Admission: RE | Admit: 2020-06-18 | Discharge: 2020-06-18 | Disposition: A | Discharge status: Home / Self Care | Payer: Medicare Other | Source: Ambulatory Visit | Attending: Vascular Surgery | Admitting: Vascular Surgery

## 2020-06-18 ENCOUNTER — Other Ambulatory Visit: Payer: Self-pay

## 2020-06-18 DIAGNOSIS — Z7902 Long term (current) use of antithrombotics/antiplatelets: Secondary | ICD-10-CM | POA: Diagnosis not present

## 2020-06-18 DIAGNOSIS — Z8719 Personal history of other diseases of the digestive system: Secondary | ICD-10-CM | POA: Diagnosis not present

## 2020-06-18 DIAGNOSIS — Z01812 Encounter for preprocedural laboratory examination: Secondary | ICD-10-CM | POA: Insufficient documentation

## 2020-06-18 DIAGNOSIS — Z7982 Long term (current) use of aspirin: Secondary | ICD-10-CM | POA: Diagnosis not present

## 2020-06-18 DIAGNOSIS — Z981 Arthrodesis status: Secondary | ICD-10-CM | POA: Diagnosis not present

## 2020-06-18 DIAGNOSIS — I63232 Cerebral infarction due to unspecified occlusion or stenosis of left carotid arteries: Secondary | ICD-10-CM | POA: Diagnosis not present

## 2020-06-18 DIAGNOSIS — I6522 Occlusion and stenosis of left carotid artery: Secondary | ICD-10-CM | POA: Diagnosis not present

## 2020-06-18 DIAGNOSIS — Z79899 Other long term (current) drug therapy: Secondary | ICD-10-CM | POA: Diagnosis not present

## 2020-06-18 DIAGNOSIS — Z8249 Family history of ischemic heart disease and other diseases of the circulatory system: Secondary | ICD-10-CM | POA: Diagnosis not present

## 2020-06-18 DIAGNOSIS — Z20822 Contact with and (suspected) exposure to covid-19: Secondary | ICD-10-CM | POA: Insufficient documentation

## 2020-06-18 DIAGNOSIS — E1151 Type 2 diabetes mellitus with diabetic peripheral angiopathy without gangrene: Secondary | ICD-10-CM | POA: Diagnosis not present

## 2020-06-18 DIAGNOSIS — Z7984 Long term (current) use of oral hypoglycemic drugs: Secondary | ICD-10-CM | POA: Diagnosis not present

## 2020-06-18 DIAGNOSIS — K279 Peptic ulcer, site unspecified, unspecified as acute or chronic, without hemorrhage or perforation: Secondary | ICD-10-CM | POA: Diagnosis not present

## 2020-06-18 DIAGNOSIS — I70219 Atherosclerosis of native arteries of extremities with intermittent claudication, unspecified extremity: Secondary | ICD-10-CM | POA: Diagnosis not present

## 2020-06-18 DIAGNOSIS — Z794 Long term (current) use of insulin: Secondary | ICD-10-CM | POA: Diagnosis not present

## 2020-06-18 DIAGNOSIS — F17290 Nicotine dependence, other tobacco product, uncomplicated: Secondary | ICD-10-CM | POA: Diagnosis not present

## 2020-06-18 DIAGNOSIS — Z820 Family history of epilepsy and other diseases of the nervous system: Secondary | ICD-10-CM | POA: Diagnosis not present

## 2020-06-18 DIAGNOSIS — Z8673 Personal history of transient ischemic attack (TIA), and cerebral infarction without residual deficits: Secondary | ICD-10-CM | POA: Diagnosis not present

## 2020-06-18 DIAGNOSIS — I10 Essential (primary) hypertension: Secondary | ICD-10-CM | POA: Diagnosis not present

## 2020-06-18 DIAGNOSIS — Z800 Family history of malignant neoplasm of digestive organs: Secondary | ICD-10-CM | POA: Diagnosis not present

## 2020-06-18 DIAGNOSIS — I6523 Occlusion and stenosis of bilateral carotid arteries: Secondary | ICD-10-CM | POA: Diagnosis not present

## 2020-06-18 LAB — SARS CORONAVIRUS 2 (TAT 6-24 HRS): SARS Coronavirus 2: NEGATIVE

## 2020-06-18 NOTE — Anesthesia Preprocedure Evaluation (Addendum)
Anesthesia Evaluation  Patient identified by MRN, date of birth, ID band Patient awake    Reviewed: Allergy & Precautions, NPO status , Patient's Chart, lab work & pertinent test results  Airway Mallampati: II  TM Distance: >3 FB     Dental   Pulmonary shortness of breath, former smoker,    breath sounds clear to auscultation       Cardiovascular hypertension, + Peripheral Vascular Disease   Rhythm:Regular Rate:Normal     Neuro/Psych CVA    GI/Hepatic PUD,   Endo/Other  diabetes  Renal/GU      Musculoskeletal   Abdominal   Peds  Hematology   Anesthesia Other Findings   Reproductive/Obstetrics                            Anesthesia Physical Anesthesia Plan  ASA: III  Anesthesia Plan: General   Post-op Pain Management:    Induction: Intravenous  PONV Risk Score and Plan: 2 and Ondansetron, Dexamethasone and Midazolam  Airway Management Planned: Oral ETT  Additional Equipment: Arterial line  Intra-op Plan:   Post-operative Plan: Possible Post-op intubation/ventilation  Informed Consent: I have reviewed the patients History and Physical, chart, labs and discussed the procedure including the risks, benefits and alternatives for the proposed anesthesia with the patient or authorized representative who has indicated his/her understanding and acceptance.     Dental advisory given  Plan Discussed with: Anesthesiologist and CRNA  Anesthesia Plan Comments:        Anesthesia Quick Evaluation

## 2020-06-18 NOTE — Progress Notes (Signed)
Mr. Kurt [name] denies chest pain or shortness of breath. Patient was tested for Covid and has been in quarantine since that time.  Mr. Kurt [name] has type II diabetes, states CBG's run around 170. I instructed patient to take 40 Units of Lantus tonight and in am if cbg> 70. I instructed patient to check CBG after awaking and every 2 hours until arrival  to the hospital. I Instructed patient if CBG is less than 70 to drink 1/2 cup of a clear juice. Recheck CBG in 15 minutes if CBG is not over 70 call, pre- op desk at [phone] for further instructions. If scheduled to receive Insulin, do not take Insulin

## 2020-06-19 ENCOUNTER — Inpatient Hospital Stay (HOSPITAL_COMMUNITY): Payer: Medicare Other | Admitting: Anesthesiology

## 2020-06-19 ENCOUNTER — Encounter (HOSPITAL_COMMUNITY): Payer: Self-pay | Admitting: Vascular Surgery

## 2020-06-19 ENCOUNTER — Inpatient Hospital Stay (HOSPITAL_COMMUNITY)
Admission: RE | Admit: 2020-06-19 | Discharge: 2020-06-20 | DRG: 039 | Disposition: A | Discharge status: Home / Self Care | Payer: Medicare Other | Attending: Vascular Surgery | Admitting: Vascular Surgery

## 2020-06-19 ENCOUNTER — Other Ambulatory Visit: Payer: Self-pay

## 2020-06-19 ENCOUNTER — Encounter (HOSPITAL_COMMUNITY)
Admission: RE | Disposition: A | Discharge status: Home / Self Care | Payer: Self-pay | Source: Home / Self Care | Attending: Vascular Surgery

## 2020-06-19 DIAGNOSIS — Z20822 Contact with and (suspected) exposure to covid-19: Secondary | ICD-10-CM | POA: Diagnosis present

## 2020-06-19 DIAGNOSIS — Z794 Long term (current) use of insulin: Secondary | ICD-10-CM

## 2020-06-19 DIAGNOSIS — Z79899 Other long term (current) drug therapy: Secondary | ICD-10-CM

## 2020-06-19 DIAGNOSIS — Z8719 Personal history of other diseases of the digestive system: Secondary | ICD-10-CM | POA: Diagnosis not present

## 2020-06-19 DIAGNOSIS — E1151 Type 2 diabetes mellitus with diabetic peripheral angiopathy without gangrene: Secondary | ICD-10-CM | POA: Diagnosis present

## 2020-06-19 DIAGNOSIS — Z981 Arthrodesis status: Secondary | ICD-10-CM | POA: Diagnosis not present

## 2020-06-19 DIAGNOSIS — Z8673 Personal history of transient ischemic attack (TIA), and cerebral infarction without residual deficits: Secondary | ICD-10-CM | POA: Diagnosis not present

## 2020-06-19 DIAGNOSIS — I10 Essential (primary) hypertension: Secondary | ICD-10-CM | POA: Diagnosis present

## 2020-06-19 DIAGNOSIS — Z7982 Long term (current) use of aspirin: Secondary | ICD-10-CM

## 2020-06-19 DIAGNOSIS — I6523 Occlusion and stenosis of bilateral carotid arteries: Secondary | ICD-10-CM | POA: Diagnosis present

## 2020-06-19 DIAGNOSIS — Z7984 Long term (current) use of oral hypoglycemic drugs: Secondary | ICD-10-CM

## 2020-06-19 DIAGNOSIS — Z8249 Family history of ischemic heart disease and other diseases of the circulatory system: Secondary | ICD-10-CM | POA: Diagnosis not present

## 2020-06-19 DIAGNOSIS — I70219 Atherosclerosis of native arteries of extremities with intermittent claudication, unspecified extremity: Secondary | ICD-10-CM | POA: Diagnosis present

## 2020-06-19 DIAGNOSIS — Z7902 Long term (current) use of antithrombotics/antiplatelets: Secondary | ICD-10-CM | POA: Diagnosis not present

## 2020-06-19 DIAGNOSIS — I6522 Occlusion and stenosis of left carotid artery: Secondary | ICD-10-CM | POA: Diagnosis not present

## 2020-06-19 DIAGNOSIS — Z820 Family history of epilepsy and other diseases of the nervous system: Secondary | ICD-10-CM

## 2020-06-19 DIAGNOSIS — F17290 Nicotine dependence, other tobacco product, uncomplicated: Secondary | ICD-10-CM | POA: Diagnosis present

## 2020-06-19 DIAGNOSIS — Z800 Family history of malignant neoplasm of digestive organs: Secondary | ICD-10-CM | POA: Diagnosis not present

## 2020-06-19 HISTORY — PX: ENDARTERECTOMY: SHX5162

## 2020-06-19 LAB — GLUCOSE, CAPILLARY
Glucose-Capillary: 139 mg/dL — ABNORMAL HIGH (ref 70–99)
Glucose-Capillary: 213 mg/dL — ABNORMAL HIGH (ref 70–99)
Glucose-Capillary: 237 mg/dL — ABNORMAL HIGH (ref 70–99)
Glucose-Capillary: 254 mg/dL — ABNORMAL HIGH (ref 70–99)
Glucose-Capillary: 300 mg/dL — ABNORMAL HIGH (ref 70–99)
Glucose-Capillary: 354 mg/dL — ABNORMAL HIGH (ref 70–99)

## 2020-06-19 LAB — COMPREHENSIVE METABOLIC PANEL
ALT: 22 U/L (ref 0–44)
AST: 19 U/L (ref 15–41)
Albumin: 3.4 g/dL — ABNORMAL LOW (ref 3.5–5.0)
Alkaline Phosphatase: 105 U/L (ref 38–126)
Anion gap: 8 (ref 5–15)
BUN: 22 mg/dL (ref 8–23)
CO2: 23 mmol/L (ref 22–32)
Calcium: 9.3 mg/dL (ref 8.9–10.3)
Chloride: 104 mmol/L (ref 98–111)
Creatinine, Ser: 1.29 mg/dL — ABNORMAL HIGH (ref 0.61–1.24)
GFR, Estimated: >60 mL/min (ref >60)
Glucose, Bld: 142 mg/dL — ABNORMAL HIGH (ref 70–99)
Potassium: 4.1 mmol/L (ref 3.5–5.1)
Sodium: 135 mmol/L (ref 135–145)
Total Bilirubin: 0.4 mg/dL (ref 0.3–1.2)
Total Protein: 6.7 g/dL (ref 6.5–8.1)

## 2020-06-19 LAB — CBC
HCT: 37.6 % — ABNORMAL LOW (ref 39.0–52.0)
Hemoglobin: 12.5 g/dL — ABNORMAL LOW (ref 13.0–17.0)
MCH: 29.1 pg (ref 26.0–34.0)
MCHC: 33.2 g/dL (ref 30.0–36.0)
MCV: 87.4 fL (ref 80.0–100.0)
Platelets: 248 10*3/uL (ref 150–400)
RBC: 4.3 MIL/uL (ref 4.22–5.81)
RDW: 13.2 % (ref 11.5–15.5)
WBC: 9 10*3/uL (ref 4.0–10.5)
nRBC: 0 % (ref 0.0–0.2)

## 2020-06-19 LAB — URINALYSIS, ROUTINE W REFLEX MICROSCOPIC
Bacteria, UA: NONE SEEN
Bilirubin Urine: NEGATIVE
Glucose, UA: NEGATIVE mg/dL
Hgb urine dipstick: NEGATIVE
Ketones, ur: 5 mg/dL — AB
Leukocytes,Ua: NEGATIVE
Nitrite: NEGATIVE
Protein, ur: 30 mg/dL — AB
Specific Gravity, Urine: 1.019 (ref 1.005–1.030)
pH: 5 (ref 5.0–8.0)

## 2020-06-19 LAB — HEMOGLOBIN A1C
Hgb A1c MFr Bld: 9.6 % — ABNORMAL HIGH (ref 4.8–5.6)
Mean Plasma Glucose: 228.82 mg/dL

## 2020-06-19 LAB — BLOOD GAS, ARTERIAL
Acid-base deficit: 0.9 mmol/L (ref 0.0–2.0)
Bicarbonate: 23.5 mmol/L (ref 20.0–28.0)
Drawn by: 56484
FIO2: 21
O2 Saturation: 97.4 %
Patient temperature: 37
pCO2 arterial: 40.6 mmHg (ref 32.0–48.0)
pH, Arterial: 7.38 (ref 7.350–7.450)
pO2, Arterial: 95.6 mmHg (ref 83.0–108.0)

## 2020-06-19 LAB — SURGICAL PCR SCREEN
MRSA, PCR: NEGATIVE
Staphylococcus aureus: POSITIVE — AB

## 2020-06-19 LAB — PROTIME-INR
INR: 1 (ref 0.8–1.2)
Prothrombin Time: 12.3 seconds (ref 11.4–15.2)

## 2020-06-19 LAB — APTT: aPTT: 29 seconds (ref 24–36)

## 2020-06-19 LAB — TYPE AND SCREEN
ABO/RH(D): A NEG
Antibody Screen: NEGATIVE

## 2020-06-19 LAB — ABO/RH: ABO/RH(D): A NEG

## 2020-06-19 SURGERY — ENDARTERECTOMY, CAROTID
Anesthesia: General | Site: Neck | Laterality: Left

## 2020-06-19 MED ORDER — CHLORHEXIDINE GLUCONATE CLOTH 2 % EX PADS: 6.0000 | MEDICATED_PAD | Freq: Once | CUTANEOUS | Status: DC

## 2020-06-19 MED ORDER — SODIUM CHLORIDE 0.9 % IV SOLN
INTRAVENOUS | Status: DC | PRN
  Administered 2020-06-19: 500 mL

## 2020-06-19 MED ORDER — PHENYLEPHRINE 40 MCG/ML (10ML) SYRINGE FOR IV PUSH (FOR BLOOD PRESSURE SUPPORT)
PREFILLED_SYRINGE | INTRAVENOUS | Status: AC
  Filled 2020-06-19: qty 20

## 2020-06-19 MED ORDER — SODIUM CHLORIDE 0.9 % IV SOLN
INTRAVENOUS | Status: AC
  Filled 2020-06-19: qty 1.2

## 2020-06-19 MED ORDER — POLYETHYLENE GLYCOL 3350 17 G PO PACK: 17.0000 g | PACK | Freq: Every day | ORAL | Status: DC | PRN

## 2020-06-19 MED ORDER — PHENOL 1.4 % MT LIQD: 1.0000 | OROMUCOSAL | Status: DC | PRN

## 2020-06-19 MED ORDER — SUGAMMADEX SODIUM 200 MG/2ML IV SOLN
INTRAVENOUS | Status: DC | PRN
  Administered 2020-06-19: 200 mg via INTRAVENOUS

## 2020-06-19 MED ORDER — SODIUM CHLORIDE 0.9 % IV SOLN
0.0125 ug/kg/min | INTRAVENOUS | Status: DC
  Filled 2020-06-19: qty 2000

## 2020-06-19 MED ORDER — POTASSIUM CHLORIDE CRYS ER 20 MEQ PO TBCR
20.0000 meq | EXTENDED_RELEASE_TABLET | Freq: Every day | ORAL | Status: DC | PRN
Start: 2020-06-19 — End: 2020-06-20

## 2020-06-19 MED ORDER — PROPOFOL 10 MG/ML IV BOLUS
INTRAVENOUS | Status: DC | PRN
  Administered 2020-06-19: 150 mg via INTRAVENOUS

## 2020-06-19 MED ORDER — SODIUM CHLORIDE 0.9 % IV SOLN: 500.0000 mL | Freq: Once | INTRAVENOUS | Status: DC | PRN

## 2020-06-19 MED ORDER — INSULIN ASPART 100 UNIT/ML ~~LOC~~ SOLN
0.0000 [IU] | Freq: Three times a day (TID) | SUBCUTANEOUS | Status: DC
Start: 2020-06-20 — End: 2020-06-20
  Administered 2020-06-20: 7 [IU] via SUBCUTANEOUS

## 2020-06-19 MED ORDER — LIDOCAINE HCL (PF) 1 % IJ SOLN
INTRAMUSCULAR | Status: AC
  Filled 2020-06-19: qty 30

## 2020-06-19 MED ORDER — HEMOSTATIC AGENTS (NO CHARGE) OPTIME
TOPICAL | Status: DC | PRN
  Administered 2020-06-19: 1 via TOPICAL

## 2020-06-19 MED ORDER — ACETAMINOPHEN 650 MG RE SUPP
325.0000 mg | RECTAL | Status: DC | PRN
Start: 2020-06-19 — End: 2020-06-20

## 2020-06-19 MED ORDER — BENAZEPRIL HCL 5 MG PO TABS
40.0000 mg | ORAL_TABLET | Freq: Every day | ORAL | Status: DC
  Administered 2020-06-20: 40 mg via ORAL
  Filled 2020-06-19: qty 8

## 2020-06-19 MED ORDER — PENTOXIFYLLINE ER 400 MG PO TBCR
400.0000 mg | EXTENDED_RELEASE_TABLET | Freq: Three times a day (TID) | ORAL | Status: DC
  Administered 2020-06-19 – 2020-06-20 (×2): 400 mg via ORAL
  Filled 2020-06-19 (×5): qty 1

## 2020-06-19 MED ORDER — AMLODIPINE BESYLATE 10 MG PO TABS
10.0000 mg | ORAL_TABLET | Freq: Every day | ORAL | Status: DC
  Administered 2020-06-19: 10 mg via ORAL
  Filled 2020-06-19 (×2): qty 1

## 2020-06-19 MED ORDER — CALCIUM CHLORIDE 10 % IV SOLN
INTRAVENOUS | Status: DC | PRN
  Administered 2020-06-19 (×3): 25 mg via INTRAVENOUS

## 2020-06-19 MED ORDER — ONDANSETRON HCL 4 MG/2ML IJ SOLN
INTRAMUSCULAR | Status: AC
  Filled 2020-06-19: qty 2

## 2020-06-19 MED ORDER — CHLORHEXIDINE GLUCONATE CLOTH 2 % EX PADS
6.0000 | MEDICATED_PAD | Freq: Every day | CUTANEOUS | Status: DC
  Administered 2020-06-20: 6 via TOPICAL

## 2020-06-19 MED ORDER — INSULIN ASPART 100 UNIT/ML ~~LOC~~ SOLN
0.0000 [IU] | Freq: Every day | SUBCUTANEOUS | Status: DC
  Administered 2020-06-19: 5 [IU] via SUBCUTANEOUS

## 2020-06-19 MED ORDER — DOXAZOSIN MESYLATE 2 MG PO TABS
4.0000 mg | ORAL_TABLET | Freq: Every evening | ORAL | Status: DC
  Administered 2020-06-19: 4 mg via ORAL
  Filled 2020-06-19: qty 2

## 2020-06-19 MED ORDER — MAGNESIUM SULFATE 2 GM/50ML IV SOLN: 2.0000 g | Freq: Every day | INTRAVENOUS | Status: DC | PRN

## 2020-06-19 MED ORDER — HEPARIN SODIUM (PORCINE) 1000 UNIT/ML IJ SOLN
INTRAMUSCULAR | Status: DC | PRN
  Administered 2020-06-19: 10000 [IU] via INTRAVENOUS

## 2020-06-19 MED ORDER — PANTOPRAZOLE SODIUM 40 MG PO TBEC
40.0000 mg | DELAYED_RELEASE_TABLET | Freq: Every day | ORAL | Status: DC
  Administered 2020-06-19 – 2020-06-20 (×2): 40 mg via ORAL
  Filled 2020-06-19 (×2): qty 1

## 2020-06-19 MED ORDER — MIDAZOLAM HCL 2 MG/2ML IJ SOLN
INTRAMUSCULAR | Status: AC
  Filled 2020-06-19: qty 2

## 2020-06-19 MED ORDER — DEXAMETHASONE SODIUM PHOSPHATE 10 MG/ML IJ SOLN
INTRAMUSCULAR | Status: DC | PRN
  Administered 2020-06-19: 10 mg via INTRAVENOUS

## 2020-06-19 MED ORDER — FENTANYL CITRATE (PF) 250 MCG/5ML IJ SOLN
INTRAMUSCULAR | Status: AC
  Filled 2020-06-19: qty 5

## 2020-06-19 MED ORDER — ALUM & MAG HYDROXIDE-SIMETH 200-200-20 MG/5ML PO SUSP: 15.0000 mL | ORAL | Status: DC | PRN

## 2020-06-19 MED ORDER — LACTATED RINGERS IV SOLN: INTRAVENOUS | Status: DC

## 2020-06-19 MED ORDER — INSULIN GLARGINE 100 UNIT/ML ~~LOC~~ SOLN
80.0000 [IU] | Freq: Two times a day (BID) | SUBCUTANEOUS | Status: DC
  Administered 2020-06-19 – 2020-06-20 (×2): 80 [IU] via SUBCUTANEOUS
  Filled 2020-06-19 (×4): qty 0.8

## 2020-06-19 MED ORDER — ATORVASTATIN CALCIUM 40 MG PO TABS
40.0000 mg | ORAL_TABLET | Freq: Every day | ORAL | Status: DC
  Administered 2020-06-19 – 2020-06-20 (×2): 40 mg via ORAL
  Filled 2020-06-19 (×2): qty 1

## 2020-06-19 MED ORDER — FENTANYL CITRATE (PF) 100 MCG/2ML IJ SOLN
25.0000 ug | INTRAMUSCULAR | Status: DC | PRN
  Administered 2020-06-19 (×2): 50 ug via INTRAVENOUS

## 2020-06-19 MED ORDER — OXYCODONE-ACETAMINOPHEN 5-325 MG PO TABS
1.0000 | ORAL_TABLET | ORAL | Status: DC | PRN
  Administered 2020-06-19 – 2020-06-20 (×3): 2 via ORAL
  Filled 2020-06-19 (×3): qty 2

## 2020-06-19 MED ORDER — ROCURONIUM BROMIDE 10 MG/ML (PF) SYRINGE
PREFILLED_SYRINGE | INTRAVENOUS | Status: DC | PRN
  Administered 2020-06-19: 50 mg via INTRAVENOUS
  Administered 2020-06-19: 30 mg via INTRAVENOUS

## 2020-06-19 MED ORDER — PHENYLEPHRINE 40 MCG/ML (10ML) SYRINGE FOR IV PUSH (FOR BLOOD PRESSURE SUPPORT)
PREFILLED_SYRINGE | INTRAVENOUS | Status: DC | PRN
  Administered 2020-06-19 (×4): 120 ug via INTRAVENOUS
  Administered 2020-06-19: 80 ug via INTRAVENOUS

## 2020-06-19 MED ORDER — PHENYLEPHRINE HCL-NACL 10-0.9 MG/250ML-% IV SOLN
INTRAVENOUS | Status: DC | PRN
  Administered 2020-06-19: 50 ug/min via INTRAVENOUS

## 2020-06-19 MED ORDER — FENTANYL CITRATE (PF) 100 MCG/2ML IJ SOLN
INTRAMUSCULAR | Status: AC
  Filled 2020-06-19: qty 2

## 2020-06-19 MED ORDER — CEFAZOLIN SODIUM-DEXTROSE 2-4 GM/100ML-% IV SOLN
2.0000 g | Freq: Three times a day (TID) | INTRAVENOUS | Status: AC
  Administered 2020-06-19 – 2020-06-20 (×2): 2 g via INTRAVENOUS
  Filled 2020-06-19 (×2): qty 100

## 2020-06-19 MED ORDER — LIDOCAINE 2% (20 MG/ML) 5 ML SYRINGE
INTRAMUSCULAR | Status: AC
  Filled 2020-06-19: qty 5

## 2020-06-19 MED ORDER — INSULIN ASPART 100 UNIT/ML ~~LOC~~ SOLN
8.0000 [IU] | Freq: Once | SUBCUTANEOUS | Status: AC
  Administered 2020-06-19: 8 [IU] via SUBCUTANEOUS

## 2020-06-19 MED ORDER — GABAPENTIN 300 MG PO CAPS
900.0000 mg | ORAL_CAPSULE | Freq: Three times a day (TID) | ORAL | Status: DC
  Administered 2020-06-19 – 2020-06-20 (×3): 900 mg via ORAL
  Filled 2020-06-19 (×3): qty 3

## 2020-06-19 MED ORDER — LACTATED RINGERS IV SOLN: INTRAVENOUS | Status: DC | PRN

## 2020-06-19 MED ORDER — ASPIRIN 325 MG PO TABS
325.0000 mg | ORAL_TABLET | Freq: Every day | ORAL | Status: DC
  Administered 2020-06-20: 325 mg via ORAL
  Filled 2020-06-19: qty 1

## 2020-06-19 MED ORDER — METOPROLOL TARTRATE 5 MG/5ML IV SOLN: 2.0000 mg | INTRAVENOUS | Status: DC | PRN

## 2020-06-19 MED ORDER — ONDANSETRON HCL 4 MG/2ML IJ SOLN
INTRAMUSCULAR | Status: DC | PRN
  Administered 2020-06-19: 4 mg via INTRAVENOUS

## 2020-06-19 MED ORDER — ACETAMINOPHEN 325 MG PO TABS
325.0000 mg | ORAL_TABLET | ORAL | Status: DC | PRN
  Administered 2020-06-19: 650 mg via ORAL
  Filled 2020-06-19: qty 2

## 2020-06-19 MED ORDER — ORAL CARE MOUTH RINSE: 15.0000 mL | Freq: Once | OROMUCOSAL | Status: AC

## 2020-06-19 MED ORDER — LABETALOL HCL 5 MG/ML IV SOLN: 10.0000 mg | INTRAVENOUS | Status: DC | PRN

## 2020-06-19 MED ORDER — 0.9 % SODIUM CHLORIDE (POUR BTL) OPTIME
TOPICAL | Status: DC | PRN
  Administered 2020-06-19: 1000 mL

## 2020-06-19 MED ORDER — ROCURONIUM BROMIDE 10 MG/ML (PF) SYRINGE
PREFILLED_SYRINGE | INTRAVENOUS | Status: AC
  Filled 2020-06-19: qty 10

## 2020-06-19 MED ORDER — MORPHINE SULFATE (PF) 2 MG/ML IV SOLN
2.0000 mg | INTRAVENOUS | Status: DC | PRN
  Administered 2020-06-19 – 2020-06-20 (×2): 2 mg via INTRAVENOUS
  Filled 2020-06-19 (×2): qty 1

## 2020-06-19 MED ORDER — EPHEDRINE SULFATE-NACL 50-0.9 MG/10ML-% IV SOSY
PREFILLED_SYRINGE | INTRAVENOUS | Status: DC | PRN
  Administered 2020-06-19 (×4): 10 mg via INTRAVENOUS

## 2020-06-19 MED ORDER — HYDRALAZINE HCL 20 MG/ML IJ SOLN: 5.0000 mg | INTRAMUSCULAR | Status: DC | PRN

## 2020-06-19 MED ORDER — CHLORHEXIDINE GLUCONATE 0.12 % MT SOLN
15.0000 mL | Freq: Once | OROMUCOSAL | Status: AC
  Administered 2020-06-19: 15 mL via OROMUCOSAL
  Filled 2020-06-19: qty 15

## 2020-06-19 MED ORDER — INSULIN ASPART 100 UNIT/ML ~~LOC~~ SOLN
0.0000 [IU] | Freq: Three times a day (TID) | SUBCUTANEOUS | Status: DC
  Administered 2020-06-19: 8 [IU] via SUBCUTANEOUS

## 2020-06-19 MED ORDER — GLIMEPIRIDE 4 MG PO TABS
4.0000 mg | ORAL_TABLET | Freq: Two times a day (BID) | ORAL | Status: DC
  Administered 2020-06-20: 4 mg via ORAL
  Filled 2020-06-19 (×2): qty 1

## 2020-06-19 MED ORDER — SODIUM CHLORIDE 0.9 % IV SOLN: INTRAVENOUS | Status: DC

## 2020-06-19 MED ORDER — GUAIFENESIN-DM 100-10 MG/5ML PO SYRP: 15.0000 mL | ORAL_SOLUTION | ORAL | Status: DC | PRN

## 2020-06-19 MED ORDER — MIDAZOLAM HCL 5 MG/5ML IJ SOLN
INTRAMUSCULAR | Status: DC | PRN
  Administered 2020-06-19: 2 mg via INTRAVENOUS

## 2020-06-19 MED ORDER — FENTANYL CITRATE (PF) 250 MCG/5ML IJ SOLN
INTRAMUSCULAR | Status: DC | PRN
  Administered 2020-06-19 (×4): 50 ug via INTRAVENOUS

## 2020-06-19 MED ORDER — EPHEDRINE 5 MG/ML INJ
INTRAVENOUS | Status: AC
  Filled 2020-06-19: qty 10

## 2020-06-19 MED ORDER — PROPOFOL 10 MG/ML IV BOLUS
INTRAVENOUS | Status: AC
  Filled 2020-06-19: qty 20

## 2020-06-19 MED ORDER — DEXAMETHASONE SODIUM PHOSPHATE 10 MG/ML IJ SOLN
INTRAMUSCULAR | Status: AC
  Filled 2020-06-19: qty 1

## 2020-06-19 MED ORDER — BISACODYL 10 MG RE SUPP: 10.0000 mg | Freq: Every day | RECTAL | Status: DC | PRN

## 2020-06-19 MED ORDER — CLOPIDOGREL BISULFATE 75 MG PO TABS
75.0000 mg | ORAL_TABLET | Freq: Every day | ORAL | Status: DC
  Administered 2020-06-20: 75 mg via ORAL
  Filled 2020-06-19: qty 1

## 2020-06-19 MED ORDER — INSULIN LISPRO (1 UNIT DIAL) 100 UNIT/ML (KWIKPEN): 15.0000 [IU] | PEN_INJECTOR | Freq: Three times a day (TID) | SUBCUTANEOUS | Status: DC

## 2020-06-19 MED ORDER — DOCUSATE SODIUM 100 MG PO CAPS
100.0000 mg | ORAL_CAPSULE | Freq: Every day | ORAL | Status: DC
  Administered 2020-06-20: 100 mg via ORAL
  Filled 2020-06-19: qty 1

## 2020-06-19 MED ORDER — PROTAMINE SULFATE 10 MG/ML IV SOLN
INTRAVENOUS | Status: DC | PRN
  Administered 2020-06-19: 50 mg via INTRAVENOUS

## 2020-06-19 MED ORDER — CEFAZOLIN SODIUM-DEXTROSE 2-4 GM/100ML-% IV SOLN
2.0000 g | INTRAVENOUS | Status: AC
  Administered 2020-06-19: 2 g via INTRAVENOUS
  Filled 2020-06-19: qty 100

## 2020-06-19 MED ORDER — ONDANSETRON HCL 4 MG/2ML IJ SOLN: 4.0000 mg | Freq: Four times a day (QID) | INTRAMUSCULAR | Status: DC | PRN

## 2020-06-19 MED ORDER — MUPIROCIN 2 % EX OINT
1.0000 "application " | TOPICAL_OINTMENT | Freq: Two times a day (BID) | CUTANEOUS | Status: DC
  Administered 2020-06-19 – 2020-06-20 (×2): 1 via NASAL
  Filled 2020-06-19: qty 22

## 2020-06-19 MED ORDER — ALBUMIN HUMAN 5 % IV SOLN: INTRAVENOUS | Status: DC | PRN

## 2020-06-19 MED ORDER — LIDOCAINE 2% (20 MG/ML) 5 ML SYRINGE
INTRAMUSCULAR | Status: DC | PRN
  Administered 2020-06-19: 100 mg via INTRAVENOUS

## 2020-06-19 SURGICAL SUPPLY — 50 items
ADH SKN CLS APL DERMABOND .7 (GAUZE/BANDAGES/DRESSINGS) ×1
ADPR TBG 2 MALE LL ART (MISCELLANEOUS)
BAG DECANTER FOR FLEXI CONT (MISCELLANEOUS) ×2 IMPLANT
BRUSH SCRUB EZ PLAIN DRY (MISCELLANEOUS) ×1 IMPLANT
CANISTER SUCT 3000ML PPV (MISCELLANEOUS) ×2 IMPLANT
CATH ROBINSON RED A/P 18FR (CATHETERS) ×2 IMPLANT
CLIP VESOCCLUDE MED 24/CT (CLIP) ×2 IMPLANT
CLIP VESOCCLUDE SM WIDE 24/CT (CLIP) ×2 IMPLANT
COVER PROBE W GEL 5X96 (DRAPES) ×2 IMPLANT
COVER WAND RF STERILE (DRAPES) ×2 IMPLANT
DERMABOND ADVANCED (GAUZE/BANDAGES/DRESSINGS) ×1
DERMABOND ADVANCED .7 DNX12 (GAUZE/BANDAGES/DRESSINGS) ×1 IMPLANT
DRAIN CHANNEL 15F RND FF W/TCR (WOUND CARE) IMPLANT
ELECT REM PT RETURN 9FT ADLT (ELECTROSURGICAL) ×2
ELECTRODE REM PT RTRN 9FT ADLT (ELECTROSURGICAL) ×1 IMPLANT
EVACUATOR SILICONE 100CC (DRAIN) IMPLANT
GOWN STRL REUS W/ TWL LRG LVL3 (GOWN DISPOSABLE) ×2 IMPLANT
GOWN STRL REUS W/ TWL XL LVL3 (GOWN DISPOSABLE) ×1 IMPLANT
GOWN STRL REUS W/TWL LRG LVL3 (GOWN DISPOSABLE) ×4
GOWN STRL REUS W/TWL XL LVL3 (GOWN DISPOSABLE) ×2
HEMOSTAT SNOW SURGICEL 2X4 (HEMOSTASIS) IMPLANT
INSERT FOGARTY SM (MISCELLANEOUS) IMPLANT
IV ADAPTER SYR DOUBLE MALE LL (MISCELLANEOUS) IMPLANT
KIT BASIN OR (CUSTOM PROCEDURE TRAY) ×2 IMPLANT
KIT SHUNT ARGYLE CAROTID ART 6 (VASCULAR PRODUCTS) ×2 IMPLANT
KIT TURNOVER KIT B (KITS) ×2 IMPLANT
NDL HYPO 25GX1X1/2 BEV (NEEDLE) IMPLANT
NDL SPNL 20GX3.5 QUINCKE YW (NEEDLE) IMPLANT
NEEDLE HYPO 25GX1X1/2 BEV (NEEDLE) IMPLANT
NEEDLE SPNL 20GX3.5 QUINCKE YW (NEEDLE) IMPLANT
NS IRRIG 1000ML POUR BTL (IV SOLUTION) ×6 IMPLANT
PACK CAROTID (CUSTOM PROCEDURE TRAY) ×2 IMPLANT
PAD ARMBOARD 7.5X6 YLW CONV (MISCELLANEOUS) ×4 IMPLANT
PATCH VASC XENOSURE 1CMX6CM (Vascular Products) ×2 IMPLANT
PATCH VASC XENOSURE 1X6 (Vascular Products) IMPLANT
POSITIONER HEAD DONUT 9IN (MISCELLANEOUS) ×2 IMPLANT
POWDER SURGICEL 3.0 GRAM (HEMOSTASIS) ×1 IMPLANT
STOPCOCK 4 WAY LG BORE MALE ST (IV SETS) IMPLANT
SUT ETHILON 3 0 PS 1 (SUTURE) IMPLANT
SUT MNCRL AB 4-0 PS2 18 (SUTURE) ×2 IMPLANT
SUT PROLENE 6 0 BV (SUTURE) ×2 IMPLANT
SUT SILK 3 0 (SUTURE)
SUT SILK 3-0 18XBRD TIE 12 (SUTURE) IMPLANT
SUT VIC AB 3-0 SH 27 (SUTURE) ×4
SUT VIC AB 3-0 SH 27X BRD (SUTURE) ×1 IMPLANT
SYR 20ML ECCENTRIC (SYRINGE) ×1 IMPLANT
SYR CONTROL 10ML LL (SYRINGE) IMPLANT
TOWEL GREEN STERILE (TOWEL DISPOSABLE) ×2 IMPLANT
TUBING ART PRESS 48 MALE/FEM (TUBING) IMPLANT
WATER STERILE IRR 1000ML POUR (IV SOLUTION) ×2 IMPLANT

## 2020-06-19 NOTE — Anesthesia Procedure Notes (Signed)
Arterial Line Insertion Start/End4/07/2020 6:55 AM, 06/19/2020 7:00 AM Performed by: Colin Benton, CRNA, CRNA  Patient location: Pre-op. Preanesthetic checklist: patient identified, IV checked, site marked, risks and benefits discussed, surgical consent, monitors and equipment checked, pre-op evaluation, timeout performed and anesthesia consent Lidocaine 1% used for infiltration Right, radial was placed Catheter size: 20 G Hand hygiene performed  and maximum sterile barriers used   Attempts: 1 Procedure performed without using ultrasound guided technique. Following insertion, dressing applied and Biopatch. Post procedure assessment: normal  Patient tolerated the procedure well with no immediate complications.

## 2020-06-19 NOTE — Transfer of Care (Signed)
Immediate Anesthesia Transfer of Care Note  Patient: Lorne Skeens  Procedure(s) Performed: LEFT CAROTID ENDARTERECTOMY (Left Neck)  Patient Location: PACU  Anesthesia Type:General  Level of Consciousness: awake, alert  and oriented  Airway & Oxygen Therapy: Patient Spontanous Breathing and Patient connected to face mask oxygen  Post-op Assessment: Report given to RN, Post -op Vital signs reviewed and stable and Patient moving all extremities  Post vital signs: Reviewed and stable  Last Vitals:  Vitals Value Taken Time  BP 100/48 06/19/20 1048  Temp    Pulse 96 06/19/20 1054  Resp 15 06/19/20 1054  SpO2 89 % 06/19/20 1054  Vitals shown include unvalidated device data.  Last Pain:  Vitals:   06/19/20 0723  TempSrc:   PainSc: 0-No pain      Patients Stated Pain Goal: 5 (83/41/96 2229)  Complications: No complications documented.

## 2020-06-19 NOTE — Progress Notes (Signed)
Patient arrived to room 4E06 in NAD, VS stable. Patient oriented to room and call bell in reach.

## 2020-06-19 NOTE — Anesthesia Procedure Notes (Signed)
Procedure Name: Intubation Date/Time: 06/19/2020 8:21 AM Performed by: Amadeo Garnet, CRNA Pre-anesthesia Checklist: Patient identified, Emergency Drugs available, Suction available and Patient being monitored Patient Re-evaluated:Patient Re-evaluated prior to induction Oxygen Delivery Method: Circle system utilized Preoxygenation: Pre-oxygenation with 100% oxygen Induction Type: IV induction Ventilation: Mask ventilation without difficulty and Oral airway inserted - appropriate to patient size Laryngoscope Size: Mac and 4 Grade View: Grade II Tube type: Oral Tube size: 7.5 mm Number of attempts: 1 Airway Equipment and Method: Stylet and Oral airway Placement Confirmation: ETT inserted through vocal cords under direct vision,  positive ETCO2 and breath sounds checked- equal and bilateral Secured at: 22 cm Tube secured with: Tape Dental Injury: Teeth and Oropharynx as per pre-operative assessment

## 2020-06-19 NOTE — Progress Notes (Signed)
Kurt [name] was worried about his CBG being 300 and me not giving him enough Insulin. 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 Dr. Donnetta Hutching gave me an order to give him an extra 8 units of novolog and to change his sliding scale to the  Resistant scale. We will recheck his CBG at 2200.

## 2020-06-19 NOTE — Interval H&P Note (Signed)
History and Physical Interval Note:  06/19/2020 7:29 AM  Kurt [name]  has presented today for surgery, with the diagnosis of Symptomatic left carotid artery stenosis.  The various methods of treatment have been discussed with the patient and family. After consideration of risks, benefits and other options for treatment, the patient has consented to  Procedure(s): LEFT CAROTID ENDARTERECTOMY (Left) as a surgical intervention.  The patient's history has been reviewed, patient examined, no change in status, stable for surgery.  I have reviewed the patient's chart and labs.  Questions were answered to the patient's satisfaction.     Servando Snare

## 2020-06-19 NOTE — Anesthesia Postprocedure Evaluation (Signed)
Anesthesia Post Note  Patient: Kurt [name]  Procedure(s) Performed: LEFT CAROTID ENDARTERECTOMY (Left Neck)     Patient location during evaluation: PACU Anesthesia Type: General Level of consciousness: awake Pain management: pain level controlled Vital Signs Assessment: post-procedure vital signs reviewed and stable Respiratory status: spontaneous breathing Cardiovascular status: stable Postop Assessment: no apparent nausea or vomiting Anesthetic complications: no   No complications documented.  Last Vitals:  Vitals:   06/19/20 1346 06/19/20 1359  BP: 126/62   Pulse: (!) 106 (!) 109  Resp: 19 15  Temp:    SpO2: 91% 91%    Last Pain:  Vitals:   06/19/20 1359  TempSrc:   PainSc: 5                  Palmyra Rogacki

## 2020-06-19 NOTE — Op Note (Signed)
Patient name: Kurt [name] MRN: [id number] DOB: Nov 22, 1960 Sex: male  06/19/2020 Pre-operative Diagnosis: Symptomatic left ICA stenosis Post-operative diagnosis:  Same Surgeon:  Erlene Quan C. Donzetta Matters, MD Assistants: Curt Jews, MD; Ethelene Hal, MS 3 Procedure Performed:  Left carotid endarterectomy with bovine pericardial patch angioplasty and 10 French shunt neuro protection  Indications: 61 year old male with history of symptomatic left carotid lesion.  He also has right ICA stenosis.  He is indicated for left carotid endarterectomy.  Findings: The common carotid was diseased.  This was divided at the level of the omohyoid.  The lesion at the bifurcation of the carotid was significantly stenosed with what appeared like a napkin ring.  1 cm past this the lesion did end we had good feathering.  A 10 French shunt was placed during endarterectomy.  After patch angioplasty there were expected signals distally in the external and internal carotid arteries with flow throughout diastole in the internal carotid artery suggestive of low resistance.  Patient was neurologically intact upon awakening from anesthesia.   Procedure:  The patient was identified in the holding area and taken to the operating was placed supine on upper table.  General anesthesia was induced.  He was sterilely prepped and draped in the left neck and chest in usual fashion, antibiotics were ministered time was called.  We began with incision along the anterior border the sternocleidomastoid.  We dissected down through the skin subcutaneous tissue and platysma.  We identified a facial vein this was divided ensuring there was no nerve behind it.  The IJ was then reflected laterally.  We identified the common carotid artery.  The patient was fully heparinized at this time and ACT returned greater than 280.  Umbilical tape was placed from the common carotid artery.  We dissected prior to the external carotid artery and a vessel loop was  placed around this.  The superior thyroidal artery was divided.  We identified her hypoglossal nerve.  We dissected higher to the ICA which was noted to be normal and a vessel loop was placed around this as well.  The ansa cervicalis was divided for better exposure.  A crossing branch likely occipital was divided.  We then prepared a 10 [place] shunt.  We clamped the ICA followed by common carotid and external carotid arteries in that order.  We opened the vessel longitudinally.  A 10 French shunt was placed distally allowed to backbleed and then placed proximally.  Flow was confirmed with Doppler.  We proceeded with endarterectomy we did try to soften the common carotid artery.  Distally the ICA with good feathering.  We had backbleeding from our external carotid artery.  We then prepared a bovine pericardial patch and sewed this in place with 6-0 Prolene.  Prior completion we removed our shunt.  We had good backbleeding from our ICA good but antegrade bleeding from the common carotid artery.  We thoroughly irrigated the carotid bulb.  We completed our patch.  We released our ICA clamp and reclamped.  We then released our external clamp followed by common carotid artery clamp and after several cardiac cycles are internal clamp.  There would be expected signals in the ICA and ECA.  We did place one repair stitch.  50 mg of protamine was administered.  We obtained stasis meticulously and irrigated the wound.  We closed the platysma with Vicryl followed by the skin with Monocryl.  Dermabond placed to the level of skin.  He was then awakened from anesthesia  having tolerated procedure well without any complication.  All counts were correct at completion.  EBL: 100 cc   Kurt [name] C. Donzetta Matters, MD [facility] [place] Office: [phone] Pager: [phone]

## 2020-06-19 NOTE — Discharge Instructions (Signed)
   [facility] [place]  Discharge Instructions   Carotid Endarterectomy (CEA)  Please refer to the following instructions for your post-procedure care. Your surgeon or physician assistant will discuss any changes with you.  Activity  You are encouraged to walk as much as you can. You can slowly return to normal activities but must avoid strenuous activity and heavy lifting until your doctor tell you it's OK. Avoid activities such as vacuuming or swinging a golf club. You can drive after one week if you are comfortable and you are no longer taking prescription pain medications. It is normal to feel tired for serval weeks after your surgery. It is also normal to have difficulty with sleep habits, eating, and bowel movements after surgery. These will go away with time.  Bathing/Showering  You may shower after you come home. Do not soak in a bathtub, hot tub, or swim until the incision heals completely.  Incision Care  Shower every day. Clean your incision with mild soap and water. Pat the area dry with a clean towel. You do not need a bandage unless otherwise instructed. Do not apply any ointments or creams to your incision. You may have skin glue on your incision. Do not peel it off. It will come off on its own in about one week. Your incision may feel thickened and raised for several weeks after your surgery. This is normal and the skin will soften over time. For Men Only: It's OK to shave around the incision but do not shave the incision itself for 2 weeks. It is common to have numbness under your chin that could last for several months.  Diet  Resume your normal diet. There are no special food restrictions following this procedure. A low fat/low cholesterol diet is recommended for all patients with vascular disease. In order to heal from your surgery, it is CRITICAL to get adequate nutrition. Your body requires vitamins, minerals, and protein. Vegetables are the best  source of vitamins and minerals. Vegetables also provide the perfect balance of protein. Processed food has little nutritional value, so try to avoid this.        Medications  Resume taking all of your medications unless your doctor or physician assistant tells you not to. If your incision is causing pain, you may take over-the- counter pain relievers such as acetaminophen (Tylenol). If you were prescribed a stronger pain medication, please be aware these medications can cause nausea and constipation. Prevent nausea by taking the medication with a snack or meal. Avoid constipation by drinking plenty of fluids and eating foods with a high amount of fiber, such as fruits, vegetables, and grains. Do not take Tylenol if you are taking prescription pain medications.  Follow Up  Our office will schedule a follow up appointment 2-3 weeks following discharge.  Please call us immediately for any of the following conditions  Increased pain, redness, drainage (pus) from your incision site. Fever of 101 degrees or higher. If you should develop stroke (slurred speech, difficulty swallowing, weakness on one side of your body, loss of vision) you should call 911 and go to the nearest emergency room.  Reduce your risk of vascular disease:  Stop smoking. If you would like help call [facility] at 1-800-QUIT-NOW (1-800-784-8669) or [place] at 336-586-4000. Manage your cholesterol Maintain a desired weight Control your diabetes Keep your blood pressure down  If you have any questions, please call the office at [phone].   

## 2020-06-19 NOTE — Progress Notes (Signed)
   06/19/20 1541  Assess: MEWS Score  Temp 98.2 F (36.8 C)  BP 139/66  Pulse Rate (!) 110  ECG Heart Rate (!) 111  Resp 15  Level of Consciousness Alert  SpO2 93 %  O2 Device Room Air  Assess: MEWS Score  MEWS Temp 0  MEWS Systolic 0  MEWS Pulse 2  MEWS RR 0  MEWS LOC 0  MEWS Score 2  MEWS Score Color Yellow  Assess: if the MEWS score is Yellow or Red  Were vital signs taken at a resting state? Yes  Focused Assessment Change from prior assessment (see assessment flowsheet)  Early Detection of Sepsis Score *See Row Information* Low  Treat  Pain Scale 0-10  Pain Score 0  Take Vital Signs  Increase Vital Sign Frequency  Yellow: Q 2hr X 2 then Q 4hr X 2, if remains yellow, continue Q 4hrs  Escalate  MEWS: Escalate Yellow: discuss with charge nurse/RN and consider discussing with provider and RRT  Notify: Charge Nurse/RN  Name of Charge Nurse/RN Notified Erasmo Downer  Date Charge Nurse/RN Notified 06/19/20  Time Charge Nurse/RN Notified 1600  Document  Patient Outcome Other (Comment) (patient stable)

## 2020-06-20 ENCOUNTER — Encounter (HOSPITAL_COMMUNITY): Payer: Self-pay | Admitting: Vascular Surgery

## 2020-06-20 LAB — POCT ACTIVATED CLOTTING TIME: Activated Clotting Time: 285 seconds

## 2020-06-20 LAB — BASIC METABOLIC PANEL
Anion gap: 9 (ref 5–15)
BUN: 22 mg/dL (ref 8–23)
CO2: 25 mmol/L (ref 22–32)
Calcium: 9 mg/dL (ref 8.9–10.3)
Chloride: 102 mmol/L (ref 98–111)
Creatinine, Ser: 1.4 mg/dL — ABNORMAL HIGH (ref 0.61–1.24)
GFR, Estimated: 57 mL/min — ABNORMAL LOW (ref >60)
Glucose, Bld: 233 mg/dL — ABNORMAL HIGH (ref 70–99)
Potassium: 4.3 mmol/L (ref 3.5–5.1)
Sodium: 136 mmol/L (ref 135–145)

## 2020-06-20 LAB — CBC
HCT: 33.8 % — ABNORMAL LOW (ref 39.0–52.0)
Hemoglobin: 11.3 g/dL — ABNORMAL LOW (ref 13.0–17.0)
MCH: 28.8 pg (ref 26.0–34.0)
MCHC: 33.4 g/dL (ref 30.0–36.0)
MCV: 86.2 fL (ref 80.0–100.0)
Platelets: 257 10*3/uL (ref 150–400)
RBC: 3.92 MIL/uL — ABNORMAL LOW (ref 4.22–5.81)
RDW: 13.2 % (ref 11.5–15.5)
WBC: 13.6 10*3/uL — ABNORMAL HIGH (ref 4.0–10.5)
nRBC: 0 % (ref 0.0–0.2)

## 2020-06-20 LAB — GLUCOSE, CAPILLARY: Glucose-Capillary: 215 mg/dL — ABNORMAL HIGH (ref 70–99)

## 2020-06-20 MED ORDER — OXYCODONE-ACETAMINOPHEN 5-325 MG PO TABS: 1.0000 | ORAL_TABLET | ORAL | 0 refills | Status: AC | PRN

## 2020-06-20 NOTE — Discharge Summary (Signed)
Discharge Summary     Kurt [name] 1960-09-23 60 y.o. male  [id number]  Admission Date: 06/19/2020  Discharge Date: 06/20/2020 Physician: Dr. Donzetta Matters Admission Diagnosis: Left carotid artery stenosis [I65.22]   HPI:   This is a 60 y.o. male  with history of symptomatic left carotid lesion.  He also has right ICA stenosis.  He is indicated for left carotid endarterectomy.  Hospital Course:  The patient was admitted to the hospital and taken to the operating room on 06/19/2020 and underwent left carotid endarterectomy.    Findings: The common carotid was diseased.  This was divided at the level of the omohyoid.  The lesion at the bifurcation of the carotid was significantly stenosed with what appeared like a napkin ring.  1 cm past this the lesion did end we had good feathering.  A 10 French shunt was placed during endarterectomy.  After patch angioplasty there were expected signals distally in the external and internal carotid arteries with flow throughout diastole in the internal carotid artery suggestive of low resistance.  Patient was neurologically intact upon awakening from anesthesia.  The pt tolerated the procedure well and was transported to the PACU in excellent condition.   By POD 1, the pt neuro status intact. Hemodynamically stable. No hematoma. Pain controlled. Voiding spontaneously. Tolerating diet.  The remainder of the hospital course consisted of increasing mobilization and increasing intake of solids without difficulty.   Recent Labs    06/19/20 0700 06/20/20 0430  NA 135 136  K 4.1 4.3  CL 104 102  CO2 23 25  GLUCOSE 142* 233*  BUN 22 22  CALCIUM 9.3 9.0   Recent Labs    06/19/20 0700 06/20/20 0430  WBC 9.0 13.6*  HGB 12.5* 11.3*  HCT 37.6* 33.8*  PLT 248 257   Recent Labs    06/19/20 0700  INR 1.0     Discharge Instructions    Discharge patient   Complete by: As directed    Discharge disposition: 01-Home or Self Care   Discharge  patient date: 06/20/2020      Discharge Diagnosis:  Left carotid artery stenosis [I65.22]  Secondary Diagnosis: Patient Active Problem List   Diagnosis Date Noted  . Left carotid artery stenosis 06/19/2020  . Cecal polyp 10/22/2018  . Hx of colonic polyps 10/22/2018  . Change in bowel habit 10/22/2018  . Fecal urgency 10/22/2018  . RLQ abdominal pain 10/22/2018  . CAROTID ARTERY STENOSIS 09/06/2009  . TOBACCO USER 01/12/2009  . DIZZINESS 12/06/2008  . CAROTID BRUIT 12/06/2008  . DYSPNEA 12/06/2008  . CHEST PAIN UNSPECIFIED 12/06/2008  . HYPERTENSION 12/04/2008  . ATHEROSCLEROTIC VASCULAR DISEASE 12/04/2008  . PEPTIC ULCER DISEASE 12/04/2008   Past Medical History:  Diagnosis Date  . Carotid stenosis    moderate to severe bilateral carotid stenosis  . Cecal polyp    large cecal polyp  . Claudication ([place])   . Diabetes mellitus without complication ([place])   . Hemorrhoids 09/2018  . History of colon polyps 09/2018  . Hypertension   . Obesity   . Peripheral vascular disease ([place])   . Stroke [facility])    no residual    Allergies as of 06/20/2020   No Known Allergies     Medication List    TAKE these medications   amLODipine 10 MG tablet Commonly known as: NORVASC Take 10 mg by mouth daily.   aspirin 325 MG tablet Take 325 mg by mouth daily.   atorvastatin 40 MG tablet Commonly  known as: LIPITOR Take 40 mg by mouth daily.   benazepril 20 MG tablet Commonly known as: LOTENSIN Take 40 mg by mouth daily.   clopidogrel 75 MG tablet Commonly known as: PLAVIX Take 75 mg by mouth daily.   cyclobenzaprine 10 MG tablet Commonly known as: FLEXERIL Take 10 mg by mouth 3 (three) times daily as needed for muscle spasms.   doxazosin 4 MG tablet Commonly known as: CARDURA Take 4 mg by mouth every evening.   gabapentin 300 MG capsule Commonly known as: NEURONTIN Take 900 mg by mouth 3 (three) times daily.   glimepiride 4 MG tablet Commonly known as: AMARYL Take 4  mg by mouth 2 (two) times daily.   HumaLOG KwikPen 100 UNIT/ML KwikPen Generic drug: insulin lispro Inject 15-30 Units into the skin 3 (three) times daily.   insulin glargine 100 unit/mL Sopn Commonly known as: LANTUS Inject 80 Units into the skin 2 (two) times daily.   meloxicam 7.5 MG tablet Commonly known as: MOBIC Take 7.5 mg by mouth 2 (two) times daily as needed for pain.   nitroGLYCERIN 0.4 MG SL tablet Commonly known as: NITROSTAT Place 0.4 mg under the tongue every 5 (five) minutes as needed for chest pain.   oxyCODONE-acetaminophen 5-325 MG tablet Commonly known as: PERCOCET/ROXICET Take 1-2 tablets by mouth every 4 (four) hours as needed for moderate pain.   pentoxifylline 400 MG CR tablet Commonly known as: TRENTAL Take 400 mg by mouth 3 (three) times daily.        [facility] Discharge Instructions Carotid Endarterectomy (CEA)  Please refer to the following instructions for your post-procedure care. Your surgeon or physician assistant will discuss any changes with you.  Activity  You are encouraged to walk as much as you can. You can slowly return to normal activities but must avoid strenuous activity and heavy lifting until your doctor tell you it's OK. Avoid activities such as vacuuming or swinging a golf club. You can drive after one week if you are comfortable and you are no longer taking prescription pain medications. It is normal to feel tired for serval weeks after your surgery. It is also normal to have difficulty with sleep habits, eating, and bowel movements after surgery. These will go away with time.  Bathing/Showering  You may shower after you come home. Do not soak in a bathtub, hot tub, or swim until the incision heals completely.  Incision Care  Shower every day. Clean your incision with mild soap and water. Pat the area dry with a clean towel. You do not need a bandage unless otherwise instructed. Do not apply  any ointments or creams to your incision. You may have skin glue on your incision. Do not peel it off. It will come off on its own in about one week. Your incision may feel thickened and raised for several weeks after your surgery. This is normal and the skin will soften over time. For Men Only: It's OK to shave around the incision but do not shave the incision itself for 2 weeks. It is common to have numbness under your chin that could last for several months.  Diet  Resume your normal diet. There are no special food restrictions following this procedure. A low fat/low cholesterol diet is recommended for all patients with vascular disease. In order to heal from your surgery, it is CRITICAL to get adequate nutrition. Your body requires vitamins, minerals, and protein. Vegetables are the best source of  vitamins and minerals. Vegetables also provide the perfect balance of protein. Processed food has little nutritional value, so try to avoid this.  Medications  Resume taking all of your medications unless your doctor or physician assistant tells you not to.  If your incision is causing pain, you may take over-the- counter pain relievers such as acetaminophen (Tylenol). If you were prescribed a stronger pain medication, please be aware these medications can cause nausea and constipation.  Prevent nausea by taking the medication with a snack or meal. Avoid constipation by drinking plenty of fluids and eating foods with a high amount of fiber, such as fruits, vegetables, and grains.  Do not take Tylenol if you are taking prescription pain medications.  Follow Up  Our office will schedule a follow up appointment 2-3 weeks following discharge.  Please call us immediately for any of the following conditions  . Increased pain, redness, drainage (pus) from your incision site. . Fever of 101 degrees or higher. . If you should develop stroke (slurred speech, difficulty swallowing, weakness on one side of your  body, loss of vision) you should call 911 and go to the nearest emergency room. .  Reduce your risk of vascular disease:  . Stop smoking. If you would like help call [facility] at 1-800-QUIT-NOW 3172367103) or [place] at (787) 818-6210. . Manage your cholesterol . Maintain a desired weight . Control your diabetes . Keep your blood pressure down .  If you have any questions, please call the office at [phone].  Prescriptions given: 1.   Roxicet #15 No Refill   Disposition: Home  Patient's condition: is Excellent  Follow up: 1. Dr. Donzetta Matters in 2 weeks.   Risa Grill, PA-C [facility] [phone]   --- For [facility] use ---   Modified Rankin score at D/C (0-6): 0  IV medication needed for:  1. Hypertension: No 2. Hypotension: No  Post-op Complications: No  1. Post-op CVA or TIA: No  If yes: Event classification (right eye, left eye, right cortical, left cortical, verterobasilar, other):   If yes: Timing of event (intra-op, <6 hrs post-op, >=6 hrs post-op, unknown):   2. CN injury: No  If yes: CN  injuried   3. Myocardial infarction: No  If yes: Dx by (EKG or clinical, Troponin):   4.  CHF: No  5.  Dysrhythmia (new): No  6. Wound infection: No  7. Reperfusion symptoms: No  8. Return to OR: No  If yes: return to OR for (bleeding, neurologic, other CEA incision, other):   Discharge medications: Statin use:  Yes ASA use:  Yes   Beta blocker use:  No ACE-Inhibitor use:  Yes  ARB use:  No CCB use: No P2Y12 Antagonist use: Yes, [x ] Plavix, [ ]  Plasugrel, [ ]  Ticlopinine, [ ]  Ticagrelor, [ ]  Other, [ ]  No for medical reason, [ ]  Non-compliant, [ ]  Not-indicated Anti-coagulant use:  No, [ ]  Warfarin, [ ]  Rivaroxaban, [ ]  Dabigatran,

## 2020-06-20 NOTE — Progress Notes (Addendum)
  Progress Note    06/20/2020 7:09 AM 1 Day Post-Op  Subjective:  Pain controlled. Voiding spontaneously. Tolerating diet.   Vitals:   06/20/20 0400 06/20/20 0600  BP: (!) 157/75   Pulse: 98   Resp: 20   Temp: 98.2 F (36.8 C)   SpO2: 95% 94%    Physical Exam: General appearance: Awake, alert in no apparent distress Cardiac: Heart rate and rhythm are regular Respirations: Nonlabored Incisions: left neck incision well approximated without bleeding or hematoma Extremities: Moves all well, 5/5 hand grip strength Neuro: A and O times 4; tongue midline, face symmetric   CBC    Component Value Date/Time   WBC 13.6 (H) 06/20/2020 0430   RBC 3.92 (L) 06/20/2020 0430   HGB 11.3 (L) 06/20/2020 0430   HCT 33.8 (L) 06/20/2020 0430   PLT 257 06/20/2020 0430   MCV 86.2 06/20/2020 0430   MCH 28.8 06/20/2020 0430   MCHC 33.4 06/20/2020 0430   RDW 13.2 06/20/2020 0430   LYMPHSABS 2.6 09/06/2009 1613   MONOABS 0.7 09/06/2009 1613   EOSABS 0.2 09/06/2009 1613   BASOSABS 0.1 09/06/2009 1613    BMET    Component Value Date/Time   NA 136 06/20/2020 0430   K 4.3 06/20/2020 0430   CL 102 06/20/2020 0430   CO2 25 06/20/2020 0430   GLUCOSE 233 (H) 06/20/2020 0430   BUN 22 06/20/2020 0430   CREATININE 1.40 (H) 06/20/2020 0430   CALCIUM 9.0 06/20/2020 0430   GFRNONAA 57 (L) 06/20/2020 0430   GFRAA  05/21/2007 0520    >60        The eGFR has been calculated using the MDRD equation. This calculation has not been validated in all clinical     Intake/Output Summary (Last 24 hours) at 06/20/2020 0709 Last data filed at 06/20/2020 0645 Gross per 24 hour  Intake 2690 ml  Output 2070 ml  Net 620 ml    HOSPITAL MEDICATIONS Scheduled Meds: . amLODipine  10 mg Oral Daily  . aspirin  325 mg Oral Daily  . atorvastatin  40 mg Oral Daily  . benazepril  40 mg Oral Daily  . Chlorhexidine Gluconate Cloth  6 each Topical Q0600  . clopidogrel  75 mg Oral Daily  . docusate sodium  100  mg Oral Daily  . doxazosin  4 mg Oral QPM  . gabapentin  900 mg Oral TID  . glimepiride  4 mg Oral BID  . insulin aspart  0-20 Units Subcutaneous TID WC  . insulin aspart  0-5 Units Subcutaneous QHS  . insulin glargine  80 Units Subcutaneous BID  . mupirocin ointment  1 application Nasal BID  . pantoprazole  40 mg Oral Daily  . pentoxifylline  400 mg Oral TID   Continuous Infusions: . sodium chloride    . sodium chloride Stopped (06/19/20 1525)  . magnesium sulfate bolus IVPB     PRN Meds:.sodium chloride, acetaminophen **OR** acetaminophen, alum & mag hydroxide-simeth, bisacodyl, guaiFENesin-dextromethorphan, hydrALAZINE, labetalol, magnesium sulfate bolus IVPB, metoprolol tartrate, morphine injection, ondansetron, oxyCODONE-acetaminophen, phenol, polyethylene glycol, potassium chloride  Assessment and Plan: POD 1 Left CEA. VSS. Afebrile. Neuro intact. DC home. Continue statin, aspirin, statin  -DVT prophylaxis:  SCDs   Risa Grill, PA-C [facility] [phone] 06/20/2020  7:09 AM   I have independently interviewed and examined patient and agree with PA assessment and plan above.   Aelyn Stanaland C. Donzetta Matters, MD [facility] [place] Office: [phone] Pager: [phone]

## 2020-06-25 ENCOUNTER — Telehealth: Payer: Self-pay

## 2020-06-25 DIAGNOSIS — E7800 Pure hypercholesterolemia, unspecified: Secondary | ICD-10-CM | POA: Insufficient documentation

## 2020-06-25 DIAGNOSIS — E1142 Type 2 diabetes mellitus with diabetic polyneuropathy: Secondary | ICD-10-CM | POA: Insufficient documentation

## 2020-06-25 DIAGNOSIS — N400 Enlarged prostate without lower urinary tract symptoms: Secondary | ICD-10-CM | POA: Insufficient documentation

## 2020-06-25 DIAGNOSIS — E119 Type 2 diabetes mellitus without complications: Secondary | ICD-10-CM | POA: Insufficient documentation

## 2020-06-25 NOTE — Telephone Encounter (Signed)
Patient is s/p L CEA on 06/19/20 by [facility] calls today to report swelling in his left neck that has been present since surgery. It is painful when he turns his neck and he cannot sleep on that side. Says "I look like the elephant man." Denies fever, chills, redness or drainage at incision site. He says it is uncomfortable to swallow. Denies any s/s of a stroke and is able to talk w/o difficulty. He wants to know if this swelling is normal - discussed with PA - will bring in patient tomorrow for follow up.

## 2020-06-26 ENCOUNTER — Encounter: Payer: Self-pay | Admitting: Physician Assistant

## 2020-06-26 ENCOUNTER — Ambulatory Visit (INDEPENDENT_AMBULATORY_CARE_PROVIDER_SITE_OTHER): Payer: Medicare Other | Admitting: Physician Assistant

## 2020-06-26 ENCOUNTER — Other Ambulatory Visit: Payer: Self-pay

## 2020-06-26 VITALS — BP 152/82 | HR 104 | Temp 98.6°F | Resp 20 | Ht 68.0 in | Wt 200.7 lb

## 2020-06-26 DIAGNOSIS — I6529 Occlusion and stenosis of unspecified carotid artery: Secondary | ICD-10-CM

## 2020-06-26 NOTE — Progress Notes (Signed)
  POST OPERATIVE OFFICE NOTE    CC:  F/u for surgery  HPI:  This is a 60 y.o. male who is s/p left CEA on 06/19/20 by Dr. Donzetta Matters.    Pt returns today for follow up.  Pt states left neck incision has been swollen, but the swelling has gone down some over the last 2 days.  He denise swallowing difficulties, SOB, weakness or loss of vision.  I reviewed his post op note and there was no documentation of hematoma.    No Known Allergies  Current Outpatient Medications  Medication Sig Dispense Refill  . amLODipine (NORVASC) 10 MG tablet Take 10 mg by mouth daily.     [name] aspirin 325 MG tablet Take 325 mg by mouth daily.    [name] atorvastatin (LIPITOR) 40 MG tablet Take 40 mg by mouth daily.    . benazepril (LOTENSIN) 20 MG tablet Take 40 mg by mouth daily.     . clopidogrel (PLAVIX) 75 MG tablet Take 75 mg by mouth daily.    . cyclobenzaprine (FLEXERIL) 10 MG tablet Take 10 mg by mouth 3 (three) times daily as needed for muscle spasms.     [name] doxazosin (CARDURA) 4 MG tablet Take 4 mg by mouth every evening.     . gabapentin (NEURONTIN) 300 MG capsule Take 900 mg by mouth 3 (three) times daily.    [name] glimepiride (AMARYL) 4 MG tablet Take 4 mg by mouth 2 (two) times daily.    [name] HUMALOG KWIKPEN 100 UNIT/ML KwikPen Inject 15-30 Units into the skin 3 (three) times daily.     . insulin glargine (LANTUS) 100 unit/mL SOPN Inject 80 Units into the skin 2 (two) times daily.    . meloxicam (MOBIC) 7.5 MG tablet Take 7.5 mg by mouth 2 (two) times daily as needed for pain.     . nitroGLYCERIN (NITROSTAT) 0.4 MG SL tablet Place 0.4 mg under the tongue every 5 (five) minutes as needed for chest pain.    [name] oxyCODONE-acetaminophen (PERCOCET/ROXICET) 5-325 MG tablet Take 1-2 tablets by mouth every 4 (four) hours as needed for moderate pain. 15 tablet 0  . pentoxifylline (TRENTAL) 400 MG CR tablet Take 400 mg by mouth 3 (three) times daily.     No current facility-administered medications for this visit.     ROS:  See  HPI  Physical Exam:    Incision:   Healing well, without erythema.  He has moderate edema with firmness to palpation.  There is no skin compromise.   Extremities:    Grip 5/5, palpable radial pulses and sensation intact B UE.  Slight left tongue deviation, no facial droop.  He has a horse voice since he was a teenager without change since surgery.    Assessment/Plan:  This is a 60 y.o. male who is s/p: Left CEA 06/19/20 Moderate hematoma without compromise or cont. Expansion.  He will f/u in 1 week for examination.  If he develops increase Hematoma, SOB, inability to swallow he will call 911.      Roxy Horseman PA-C [facility] [phone]   Clinic MD:  Stanford Breed

## 2020-07-06 ENCOUNTER — Ambulatory Visit (INDEPENDENT_AMBULATORY_CARE_PROVIDER_SITE_OTHER): Payer: Medicare Other | Admitting: Physician Assistant

## 2020-07-06 ENCOUNTER — Other Ambulatory Visit: Payer: Self-pay

## 2020-07-06 VITALS — BP 135/68 | HR 96 | Temp 98.0°F | Resp 20 | Ht 68.0 in | Wt 211.0 lb

## 2020-07-06 DIAGNOSIS — I6522 Occlusion and stenosis of left carotid artery: Secondary | ICD-10-CM

## 2020-07-06 DIAGNOSIS — R519 Headache, unspecified: Secondary | ICD-10-CM

## 2020-07-06 NOTE — Progress Notes (Addendum)
POST OPERATIVE OFFICE NOTE    CC:  F/u for surgery; 2 1/2 week post-op  HPI:  This is a 60 y.o. male who is s/p left carotid endarterectomy on June 19, 2020 secondary to symptomatic left ICA stenosis.  He returns today for incision check.  He had significant edema of his incision that did not appear to be an expanding hematoma postoperatively.  He states this is much improved over the last week.  His main complaint is left-sided headache since surgery.  He says the headache is constant and gets some measure of relief with ibuprofen.  He denies upper extremity numbness or weakness.  He does not complain of monocular blindness, slurred speech or facial drooping.  He had an episode Monday or Tuesday night while sitting on the toilet.  He described this as a brief episode of upper and lower extremity shaking.  This resolved spontaneously and has not experienced this since.  He is compliant with aspirin, statin and Plavix.  No Known Allergies  Current Outpatient Medications  Medication Sig Dispense Refill  . amLODipine (NORVASC) 10 MG tablet Take 10 mg by mouth daily.     [name] aspirin 325 MG tablet Take 325 mg by mouth daily.    [name] atorvastatin (LIPITOR) 40 MG tablet Take 40 mg by mouth daily.    . benazepril (LOTENSIN) 20 MG tablet Take 40 mg by mouth daily.     . clopidogrel (PLAVIX) 75 MG tablet Take 75 mg by mouth daily.    . cyclobenzaprine (FLEXERIL) 10 MG tablet Take 10 mg by mouth 3 (three) times daily as needed for muscle spasms.     [name] doxazosin (CARDURA) 4 MG tablet Take 4 mg by mouth every evening.     . gabapentin (NEURONTIN) 300 MG capsule Take 900 mg by mouth 3 (three) times daily.    [name] glimepiride (AMARYL) 4 MG tablet Take 4 mg by mouth 2 (two) times daily.    [name] HUMALOG KWIKPEN 100 UNIT/ML KwikPen Inject 15-30 Units into the skin 3 (three) times daily.     . insulin glargine (LANTUS) 100 unit/mL SOPN Inject 80 Units into the skin 2 (two) times daily.    . meloxicam (MOBIC) 7.5 MG  tablet Take 7.5 mg by mouth 2 (two) times daily as needed for pain.     . nitroGLYCERIN (NITROSTAT) 0.4 MG SL tablet Place 0.4 mg under the tongue every 5 (five) minutes as needed for chest pain.    [name] oxyCODONE-acetaminophen (PERCOCET/ROXICET) 5-325 MG tablet Take 1-2 tablets by mouth every 4 (four) hours as needed for moderate pain. 15 tablet 0  . pentoxifylline (TRENTAL) 400 MG CR tablet Take 400 mg by mouth 3 (three) times daily.     No current facility-administered medications for this visit.     ROS:  See HPI  BP 135/68 (BP Location: Right Arm, Patient Position: Sitting, Cuff Size: Large)   Pulse 96   Temp 98 F (36.7 C) (Temporal)   Resp 20   Ht 5\' 8"  (1.727 m)   Wt 211 lb (95.7 kg)   SpO2 95%   BMI 32.08 kg/m   Physical Exam:  General appearance: Well-developed, well-nourished in no apparent distress Cardiac: Heart rate and rhythm are regular Respiratory: Nonlabored Incision: Neck incision with prominent healing ridge.  There is no sign of infection. Extremities: He has 5 out of 5 bilateral hand grip strength, triceps, biceps, dorsiflexion plantarflexion strength is normal. Neuro: He is alert and oriented x4.  His face  is symmetric.  There is slight left tongue deviation.  Speech is clear and fluent.   Assessment/Plan:  This is a 60 y.o. male who is s/p: Left carotid endarterectomy.  Preoperative symptoms were right forearm numbness and hand weakness.  This is resolved.  He also had preoperative light sensitivity to the left eye which she says is better but continues particularly in the morning.  He is having left-sided headache since surgery.  I advised him to use heat or ice packs and to minimize ibuprofen use as he endorses some mild GI upset.  I told him I would discuss his headache and what to expect with Dr. Donzetta Matters and telephone him in this regard.  We reviewed signs and symptoms of acute stroke and I encouraged him to call EMS should these occur.  Will make arrangements  for his routine surveillance in 9 months with carotid duplex.  Aspirin, statin and Plavix.  Addendum: 07/12/2020.  I telephoned the patient at home after discussing left-sided headache with Dr. Donzetta Matters.  This was most likely due to mild hyperperfusion syndrome.  The patient states his headache is slowly improving.  I encouraged him to call us for reevaluation should headache not resolve or worsen.  He verbalizes understanding.  Risa Grill, PA-C [facility] [phone]  Clinic MD: Dr. Stanford Breed on call

## 2020-07-18 ENCOUNTER — Other Ambulatory Visit: Payer: Self-pay

## 2020-07-18 DIAGNOSIS — I6529 Occlusion and stenosis of unspecified carotid artery: Secondary | ICD-10-CM

## 2020-08-18 ENCOUNTER — Other Ambulatory Visit: Payer: Self-pay

## 2020-08-18 DIAGNOSIS — I723 Aneurysm of iliac artery: Secondary | ICD-10-CM

## 2020-09-18 ENCOUNTER — Other Ambulatory Visit: Payer: Self-pay

## 2020-09-18 MED ORDER — CLOPIDOGREL BISULFATE 75 MG PO TABS: 75.0000 mg | ORAL_TABLET | Freq: Every day | ORAL | 11 refills | Status: AC

## 2020-11-06 DIAGNOSIS — E1165 Type 2 diabetes mellitus with hyperglycemia: Secondary | ICD-10-CM | POA: Diagnosis not present

## 2020-11-06 DIAGNOSIS — L309 Dermatitis, unspecified: Secondary | ICD-10-CM | POA: Diagnosis not present

## 2020-11-06 DIAGNOSIS — M1380 Other specified arthritis, unspecified site: Secondary | ICD-10-CM | POA: Diagnosis not present

## 2020-11-06 DIAGNOSIS — Z0000 Encounter for general adult medical examination without abnormal findings: Secondary | ICD-10-CM | POA: Diagnosis not present

## 2020-11-06 DIAGNOSIS — Z1389 Encounter for screening for other disorder: Secondary | ICD-10-CM | POA: Diagnosis not present

## 2020-11-06 DIAGNOSIS — E039 Hypothyroidism, unspecified: Secondary | ICD-10-CM | POA: Diagnosis not present

## 2020-11-06 DIAGNOSIS — I700 Atherosclerosis of aorta: Secondary | ICD-10-CM | POA: Diagnosis not present

## 2020-11-06 DIAGNOSIS — N182 Chronic kidney disease, stage 2 (mild): Secondary | ICD-10-CM | POA: Diagnosis not present

## 2021-01-03 DIAGNOSIS — M25552 Pain in left hip: Secondary | ICD-10-CM | POA: Diagnosis not present

## 2021-01-03 DIAGNOSIS — M25551 Pain in right hip: Secondary | ICD-10-CM | POA: Diagnosis not present

## 2021-01-03 DIAGNOSIS — M7061 Trochanteric bursitis, right hip: Secondary | ICD-10-CM | POA: Diagnosis not present

## 2021-01-25 DIAGNOSIS — M25551 Pain in right hip: Secondary | ICD-10-CM | POA: Diagnosis not present

## 2021-03-04 ENCOUNTER — Ambulatory Visit: Payer: Medicare Other

## 2021-03-04 ENCOUNTER — Ambulatory Visit (HOSPITAL_COMMUNITY): Payer: Medicare Other

## 2021-03-06 ENCOUNTER — Inpatient Hospital Stay (HOSPITAL_COMMUNITY): Payer: Medicare Other

## 2021-03-06 ENCOUNTER — Encounter (HOSPITAL_COMMUNITY): Payer: Self-pay | Admitting: *Deleted

## 2021-03-06 ENCOUNTER — Inpatient Hospital Stay (HOSPITAL_COMMUNITY)
Admission: EM | Admit: 2021-03-06 | Discharge: 2021-03-17 | DRG: 871 | Disposition: E | Payer: Medicare Other | Attending: Internal Medicine | Admitting: Internal Medicine

## 2021-03-06 ENCOUNTER — Emergency Department (HOSPITAL_COMMUNITY): Payer: Medicare Other

## 2021-03-06 ENCOUNTER — Other Ambulatory Visit: Payer: Self-pay

## 2021-03-06 DIAGNOSIS — E872 Acidosis, unspecified: Secondary | ICD-10-CM | POA: Diagnosis not present

## 2021-03-06 DIAGNOSIS — Z6832 Body mass index (BMI) 32.0-32.9, adult: Secondary | ICD-10-CM | POA: Diagnosis not present

## 2021-03-06 DIAGNOSIS — Z515 Encounter for palliative care: Secondary | ICD-10-CM | POA: Diagnosis not present

## 2021-03-06 DIAGNOSIS — N179 Acute kidney failure, unspecified: Secondary | ICD-10-CM | POA: Diagnosis not present

## 2021-03-06 DIAGNOSIS — N170 Acute kidney failure with tubular necrosis: Secondary | ICD-10-CM | POA: Diagnosis present

## 2021-03-06 DIAGNOSIS — E1151 Type 2 diabetes mellitus with diabetic peripheral angiopathy without gangrene: Secondary | ICD-10-CM | POA: Diagnosis not present

## 2021-03-06 DIAGNOSIS — Z4682 Encounter for fitting and adjustment of non-vascular catheter: Secondary | ICD-10-CM | POA: Diagnosis not present

## 2021-03-06 DIAGNOSIS — I213 ST elevation (STEMI) myocardial infarction of unspecified site: Secondary | ICD-10-CM | POA: Diagnosis not present

## 2021-03-06 DIAGNOSIS — Z66 Do not resuscitate: Secondary | ICD-10-CM | POA: Diagnosis not present

## 2021-03-06 DIAGNOSIS — T4275XA Adverse effect of unspecified antiepileptic and sedative-hypnotic drugs, initial encounter: Secondary | ICD-10-CM | POA: Diagnosis present

## 2021-03-06 DIAGNOSIS — Z794 Long term (current) use of insulin: Secondary | ICD-10-CM

## 2021-03-06 DIAGNOSIS — R569 Unspecified convulsions: Secondary | ICD-10-CM

## 2021-03-06 DIAGNOSIS — I499 Cardiac arrhythmia, unspecified: Secondary | ICD-10-CM | POA: Diagnosis not present

## 2021-03-06 DIAGNOSIS — J9602 Acute respiratory failure with hypercapnia: Secondary | ICD-10-CM | POA: Diagnosis not present

## 2021-03-06 DIAGNOSIS — J189 Pneumonia, unspecified organism: Secondary | ICD-10-CM | POA: Diagnosis present

## 2021-03-06 DIAGNOSIS — Z8673 Personal history of transient ischemic attack (TIA), and cerebral infarction without residual deficits: Secondary | ICD-10-CM

## 2021-03-06 DIAGNOSIS — E785 Hyperlipidemia, unspecified: Secondary | ICD-10-CM | POA: Diagnosis present

## 2021-03-06 DIAGNOSIS — G931 Anoxic brain damage, not elsewhere classified: Secondary | ICD-10-CM | POA: Diagnosis not present

## 2021-03-06 DIAGNOSIS — Z7982 Long term (current) use of aspirin: Secondary | ICD-10-CM

## 2021-03-06 DIAGNOSIS — A4189 Other specified sepsis: Principal | ICD-10-CM | POA: Diagnosis present

## 2021-03-06 DIAGNOSIS — I10 Essential (primary) hypertension: Secondary | ICD-10-CM | POA: Diagnosis present

## 2021-03-06 DIAGNOSIS — I469 Cardiac arrest, cause unspecified: Secondary | ICD-10-CM | POA: Diagnosis not present

## 2021-03-06 DIAGNOSIS — Z8249 Family history of ischemic heart disease and other diseases of the circulatory system: Secondary | ICD-10-CM | POA: Diagnosis not present

## 2021-03-06 DIAGNOSIS — U071 COVID-19: Secondary | ICD-10-CM | POA: Diagnosis present

## 2021-03-06 DIAGNOSIS — G936 Cerebral edema: Secondary | ICD-10-CM | POA: Diagnosis present

## 2021-03-06 DIAGNOSIS — J9811 Atelectasis: Secondary | ICD-10-CM | POA: Diagnosis not present

## 2021-03-06 DIAGNOSIS — Z7984 Long term (current) use of oral hypoglycemic drugs: Secondary | ICD-10-CM

## 2021-03-06 DIAGNOSIS — Z9049 Acquired absence of other specified parts of digestive tract: Secondary | ICD-10-CM | POA: Diagnosis not present

## 2021-03-06 DIAGNOSIS — Z981 Arthrodesis status: Secondary | ICD-10-CM | POA: Diagnosis not present

## 2021-03-06 DIAGNOSIS — K6389 Other specified diseases of intestine: Secondary | ICD-10-CM | POA: Diagnosis not present

## 2021-03-06 DIAGNOSIS — J9690 Respiratory failure, unspecified, unspecified whether with hypoxia or hypercapnia: Secondary | ICD-10-CM | POA: Diagnosis not present

## 2021-03-06 DIAGNOSIS — Z79899 Other long term (current) drug therapy: Secondary | ICD-10-CM

## 2021-03-06 DIAGNOSIS — R404 Transient alteration of awareness: Secondary | ICD-10-CM | POA: Diagnosis not present

## 2021-03-06 DIAGNOSIS — G4089 Other seizures: Secondary | ICD-10-CM | POA: Diagnosis present

## 2021-03-06 DIAGNOSIS — E871 Hypo-osmolality and hyponatremia: Secondary | ICD-10-CM | POA: Diagnosis not present

## 2021-03-06 DIAGNOSIS — Z800 Family history of malignant neoplasm of digestive organs: Secondary | ICD-10-CM | POA: Diagnosis not present

## 2021-03-06 DIAGNOSIS — I2510 Atherosclerotic heart disease of native coronary artery without angina pectoris: Secondary | ICD-10-CM | POA: Diagnosis present

## 2021-03-06 DIAGNOSIS — J9601 Acute respiratory failure with hypoxia: Secondary | ICD-10-CM | POA: Diagnosis not present

## 2021-03-06 DIAGNOSIS — R739 Hyperglycemia, unspecified: Secondary | ICD-10-CM | POA: Diagnosis not present

## 2021-03-06 DIAGNOSIS — E1165 Type 2 diabetes mellitus with hyperglycemia: Secondary | ICD-10-CM | POA: Diagnosis not present

## 2021-03-06 DIAGNOSIS — Z87891 Personal history of nicotine dependence: Secondary | ICD-10-CM

## 2021-03-06 DIAGNOSIS — E875 Hyperkalemia: Secondary | ICD-10-CM | POA: Diagnosis present

## 2021-03-06 DIAGNOSIS — S0990XA Unspecified injury of head, initial encounter: Secondary | ICD-10-CM | POA: Diagnosis not present

## 2021-03-06 DIAGNOSIS — I952 Hypotension due to drugs: Secondary | ICD-10-CM | POA: Diagnosis present

## 2021-03-06 DIAGNOSIS — Z4659 Encounter for fitting and adjustment of other gastrointestinal appliance and device: Secondary | ICD-10-CM

## 2021-03-06 DIAGNOSIS — Z7902 Long term (current) use of antithrombotics/antiplatelets: Secondary | ICD-10-CM

## 2021-03-06 LAB — CBC
HCT: 37.4 % — ABNORMAL LOW (ref 39.0–52.0)
Hemoglobin: 12.1 g/dL — ABNORMAL LOW (ref 13.0–17.0)
MCH: 29.7 pg (ref 26.0–34.0)
MCHC: 32.4 g/dL (ref 30.0–36.0)
MCV: 91.7 fL (ref 80.0–100.0)
Platelets: 317 10*3/uL (ref 150–400)
RBC: 4.08 MIL/uL — ABNORMAL LOW (ref 4.22–5.81)
RDW: 13 % (ref 11.5–15.5)
WBC: 16.5 10*3/uL — ABNORMAL HIGH (ref 4.0–10.5)
nRBC: 0 % (ref 0.0–0.2)

## 2021-03-06 LAB — RAPID URINE DRUG SCREEN, HOSP PERFORMED
Amphetamines: NOT DETECTED
Barbiturates: NOT DETECTED
Benzodiazepines: NOT DETECTED
Cocaine: POSITIVE — AB
Opiates: NOT DETECTED
Tetrahydrocannabinol: NOT DETECTED

## 2021-03-06 LAB — I-STAT ARTERIAL BLOOD GAS, ED
Acid-base deficit: 6 mmol/L — ABNORMAL HIGH (ref 0.0–2.0)
Bicarbonate: 21.6 mmol/L (ref 20.0–28.0)
Calcium, Ion: 1.1 mmol/L — ABNORMAL LOW (ref 1.15–1.40)
HCT: 33 % — ABNORMAL LOW (ref 39.0–52.0)
Hemoglobin: 11.2 g/dL — ABNORMAL LOW (ref 13.0–17.0)
O2 Saturation: 98 %
Patient temperature: 96.4
Potassium: 4.4 mmol/L (ref 3.5–5.1)
Sodium: 136 mmol/L (ref 135–145)
TCO2: 23 mmol/L (ref 22–32)
pCO2 arterial: 49.3 mmHg — ABNORMAL HIGH (ref 32.0–48.0)
pH, Arterial: 7.243 — ABNORMAL LOW (ref 7.350–7.450)
pO2, Arterial: 111 mmHg — ABNORMAL HIGH (ref 83.0–108.0)

## 2021-03-06 LAB — URINALYSIS, ROUTINE W REFLEX MICROSCOPIC
Bilirubin Urine: NEGATIVE
Glucose, UA: >=500 mg/dL — AB
Ketones, ur: NEGATIVE mg/dL
Leukocytes,Ua: NEGATIVE
Nitrite: NEGATIVE
Protein, ur: 100 mg/dL — AB
Specific Gravity, Urine: 1.007 (ref 1.005–1.030)
pH: 6 (ref 5.0–8.0)

## 2021-03-06 LAB — GLUCOSE, CAPILLARY
Glucose-Capillary: 480 mg/dL — ABNORMAL HIGH (ref 70–99)
Glucose-Capillary: 461 mg/dL — ABNORMAL HIGH (ref 70–99)
Glucose-Capillary: 358 mg/dL — ABNORMAL HIGH (ref 70–99)
Glucose-Capillary: 307 mg/dL — ABNORMAL HIGH (ref 70–99)
Glucose-Capillary: 322 mg/dL — ABNORMAL HIGH (ref 70–99)
Glucose-Capillary: 260 mg/dL — ABNORMAL HIGH (ref 70–99)
Glucose-Capillary: 215 mg/dL — ABNORMAL HIGH (ref 70–99)
Glucose-Capillary: 191 mg/dL — ABNORMAL HIGH (ref 70–99)
Glucose-Capillary: 156 mg/dL — ABNORMAL HIGH (ref 70–99)
Glucose-Capillary: 159 mg/dL — ABNORMAL HIGH (ref 70–99)
Glucose-Capillary: 147 mg/dL — ABNORMAL HIGH (ref 70–99)
Glucose-Capillary: 91 mg/dL (ref 70–99)
Glucose-Capillary: 128 mg/dL — ABNORMAL HIGH (ref 70–99)

## 2021-03-06 LAB — I-STAT CHEM 8, ED
BUN: 21 mg/dL (ref 8–23)
Calcium, Ion: 1.02 mmol/L — ABNORMAL LOW (ref 1.15–1.40)
Chloride: 98 mmol/L (ref 98–111)
Creatinine, Ser: 2.3 mg/dL — ABNORMAL HIGH (ref 0.61–1.24)
Glucose, Bld: 433 mg/dL — ABNORMAL HIGH (ref 70–99)
HCT: 36 % — ABNORMAL LOW (ref 39.0–52.0)
Hemoglobin: 12.2 g/dL — ABNORMAL LOW (ref 13.0–17.0)
Potassium: 5.3 mmol/L — ABNORMAL HIGH (ref 3.5–5.1)
Sodium: 134 mmol/L — ABNORMAL LOW (ref 135–145)
TCO2: 25 mmol/L (ref 22–32)

## 2021-03-06 LAB — BASIC METABOLIC PANEL
Anion gap: 16 — ABNORMAL HIGH (ref 5–15)
BUN: 19 mg/dL (ref 8–23)
CO2: 20 mmol/L — ABNORMAL LOW (ref 22–32)
Calcium: 8 mg/dL — ABNORMAL LOW (ref 8.9–10.3)
Chloride: 97 mmol/L — ABNORMAL LOW (ref 98–111)
Creatinine, Ser: 2.42 mg/dL — ABNORMAL HIGH (ref 0.61–1.24)
GFR, Estimated: 29 mL/min — ABNORMAL LOW (ref >60)
Glucose, Bld: 434 mg/dL — ABNORMAL HIGH (ref 70–99)
Potassium: 5.3 mmol/L — ABNORMAL HIGH (ref 3.5–5.1)
Sodium: 133 mmol/L — ABNORMAL LOW (ref 135–145)

## 2021-03-06 LAB — PROTIME-INR
INR: 1.1 (ref 0.8–1.2)
Prothrombin Time: 13.7 seconds (ref 11.4–15.2)

## 2021-03-06 LAB — CBG MONITORING, ED
Glucose-Capillary: 387 mg/dL — ABNORMAL HIGH (ref 70–99)
Glucose-Capillary: 428 mg/dL — ABNORMAL HIGH (ref 70–99)

## 2021-03-06 LAB — HEPATIC FUNCTION PANEL
ALT: 126 U/L — ABNORMAL HIGH (ref 0–44)
AST: 206 U/L — ABNORMAL HIGH (ref 15–41)
Albumin: 3 g/dL — ABNORMAL LOW (ref 3.5–5.0)
Alkaline Phosphatase: 176 U/L — ABNORMAL HIGH (ref 38–126)
Bilirubin, Direct: 0.2 mg/dL (ref 0.0–0.2)
Indirect Bilirubin: 0.5 mg/dL (ref 0.3–0.9)
Total Bilirubin: 0.7 mg/dL (ref 0.3–1.2)
Total Protein: 6.3 g/dL — ABNORMAL LOW (ref 6.5–8.1)

## 2021-03-06 LAB — ACETAMINOPHEN LEVEL: Acetaminophen (Tylenol), Serum: <10 ug/mL — ABNORMAL LOW (ref 10–30)

## 2021-03-06 LAB — HEMOGLOBIN A1C
Hgb A1c MFr Bld: 10.3 % — ABNORMAL HIGH (ref 4.8–5.6)
Mean Plasma Glucose: 248.91 mg/dL

## 2021-03-06 LAB — ETHANOL: Alcohol, Ethyl (B): <10 mg/dL (ref <10)

## 2021-03-06 LAB — HEPARIN LEVEL (UNFRACTIONATED)
Heparin Unfractionated: <0.1 IU/mL — ABNORMAL LOW (ref 0.30–0.70)
Heparin Unfractionated: <0.1 IU/mL — ABNORMAL LOW (ref 0.30–0.70)

## 2021-03-06 LAB — LACTIC ACID, PLASMA: Lactic Acid, Venous: 4.5 mmol/L (ref 0.5–1.9)

## 2021-03-06 LAB — TROPONIN I (HIGH SENSITIVITY)
Troponin I (High Sensitivity): 27 ng/L — ABNORMAL HIGH (ref <18)
Troponin I (High Sensitivity): 54 ng/L — ABNORMAL HIGH (ref <18)

## 2021-03-06 LAB — RESP PANEL BY RT-PCR (FLU A&B, COVID) ARPGX2
Influenza A by PCR: NEGATIVE
Influenza B by PCR: NEGATIVE
SARS Coronavirus 2 by RT PCR: POSITIVE — AB

## 2021-03-06 LAB — MRSA NEXT GEN BY PCR, NASAL: MRSA by PCR Next Gen: NOT DETECTED

## 2021-03-06 LAB — HIV ANTIBODY (ROUTINE TESTING W REFLEX): HIV Screen 4th Generation wRfx: NONREACTIVE

## 2021-03-06 LAB — SALICYLATE LEVEL: Salicylate Lvl: <7 mg/dL — ABNORMAL LOW (ref 7.0–30.0)

## 2021-03-06 MED ORDER — PANTOPRAZOLE SODIUM 40 MG IV SOLR
40.0000 mg | Freq: Every day | INTRAVENOUS | Status: DC
  Administered 2021-03-06: 22:00:00 40 mg via INTRAVENOUS
  Filled 2021-03-06: qty 40

## 2021-03-06 MED ORDER — HEPARIN (PORCINE) 25000 UT/250ML-% IV SOLN
1950.0000 [IU]/h | INTRAVENOUS | Status: DC
  Administered 2021-03-06: 22:00:00 1950 [IU]/h via INTRAVENOUS
  Filled 2021-03-06: qty 250

## 2021-03-06 MED ORDER — HEPARIN (PORCINE) 25000 UT/250ML-% IV SOLN
1400.0000 [IU]/h | INTRAVENOUS | Status: DC
  Administered 2021-03-06: 06:00:00 1400 [IU]/h via INTRAVENOUS
  Filled 2021-03-06: qty 250

## 2021-03-06 MED ORDER — DOCUSATE SODIUM 50 MG/5ML PO LIQD
100.0000 mg | Freq: Two times a day (BID) | ORAL | Status: DC
  Filled 2021-03-06: qty 10

## 2021-03-06 MED ORDER — CHLORHEXIDINE GLUCONATE CLOTH 2 % EX PADS
6.0000 | MEDICATED_PAD | Freq: Every day | CUTANEOUS | Status: DC
  Administered 2021-03-06 – 2021-03-07 (×2): 6 via TOPICAL

## 2021-03-06 MED ORDER — ORAL CARE MOUTH RINSE
15.0000 mL | OROMUCOSAL | Status: DC
  Administered 2021-03-06 – 2021-03-07 (×11): 15 mL via OROMUCOSAL

## 2021-03-06 MED ORDER — NOREPINEPHRINE 4 MG/250ML-% IV SOLN
INTRAVENOUS | Status: AC | PRN
  Administered 2021-03-06: 15 ug/min via INTRAVENOUS

## 2021-03-06 MED ORDER — PROPOFOL 1000 MG/100ML IV EMUL: 5.0000 ug/kg/min | INTRAVENOUS | Status: DC

## 2021-03-06 MED ORDER — ONDANSETRON HCL 4 MG/2ML IJ SOLN: 4.0000 mg | Freq: Four times a day (QID) | INTRAMUSCULAR | Status: DC | PRN

## 2021-03-06 MED ORDER — SODIUM CHLORIDE 0.9 % IV SOLN
INTRAVENOUS | Status: AC | PRN
  Administered 2021-03-06: 1000 mL via INTRAVENOUS

## 2021-03-06 MED ORDER — ACETAMINOPHEN 650 MG RE SUPP
650.0000 mg | RECTAL | Status: DC
  Administered 2021-03-06 – 2021-03-07 (×5): 650 mg via RECTAL
  Filled 2021-03-06 (×5): qty 1

## 2021-03-06 MED ORDER — ACETAMINOPHEN 325 MG PO TABS: 650.0000 mg | ORAL_TABLET | ORAL | Status: DC

## 2021-03-06 MED ORDER — PROPOFOL 1000 MG/100ML IV EMUL
5.0000 ug/kg/min | INTRAVENOUS | Status: DC
  Administered 2021-03-06: 07:00:00 15 ug/kg/min via INTRAVENOUS

## 2021-03-06 MED ORDER — EPINEPHRINE 1 MG/10ML IJ SOSY
PREFILLED_SYRINGE | INTRAMUSCULAR | Status: AC | PRN
  Administered 2021-03-06: .5 mg via INTRAVENOUS

## 2021-03-06 MED ORDER — PROPOFOL 1000 MG/100ML IV EMUL
INTRAVENOUS | Status: AC
  Administered 2021-03-06: 06:00:00 5 ug/kg/min via INTRAVENOUS
  Filled 2021-03-06: qty 100

## 2021-03-06 MED ORDER — POLYETHYLENE GLYCOL 3350 17 G PO PACK
17.0000 g | PACK | Freq: Every day | ORAL | Status: DC
  Filled 2021-03-06: qty 1

## 2021-03-06 MED ORDER — FENTANYL CITRATE (PF) 100 MCG/2ML IJ SOLN: 50.0000 ug | INTRAMUSCULAR | Status: DC | PRN

## 2021-03-06 MED ORDER — FENTANYL CITRATE PF 50 MCG/ML IJ SOSY: 50.0000 ug | PREFILLED_SYRINGE | INTRAMUSCULAR | Status: DC | PRN

## 2021-03-06 MED ORDER — CHLORHEXIDINE GLUCONATE 0.12% ORAL RINSE (MEDLINE KIT)
15.0000 mL | Freq: Two times a day (BID) | OROMUCOSAL | Status: DC
  Administered 2021-03-06 – 2021-03-07 (×2): 15 mL via OROMUCOSAL

## 2021-03-06 MED ORDER — INSULIN REGULAR(HUMAN) IN NACL 100-0.9 UT/100ML-% IV SOLN
INTRAVENOUS | Status: DC
  Administered 2021-03-06: 19:00:00 3 [IU]/h via INTRAVENOUS
  Administered 2021-03-06: 11:00:00 11.5 [IU]/h via INTRAVENOUS
  Filled 2021-03-06 (×2): qty 100

## 2021-03-06 MED ORDER — SODIUM CHLORIDE 0.9 % IV SOLN
250.0000 mL | INTRAVENOUS | Status: DC
  Administered 2021-03-06: 06:00:00 250 mL via INTRAVENOUS

## 2021-03-06 MED ORDER — DEXTROSE 50 % IV SOLN: 0.0000 mL | INTRAVENOUS | Status: DC | PRN

## 2021-03-06 MED ORDER — FENTANYL CITRATE PF 50 MCG/ML IJ SOSY
50.0000 ug | PREFILLED_SYRINGE | INTRAMUSCULAR | Status: DC | PRN
  Administered 2021-03-06 (×2): 100 ug via INTRAVENOUS
  Filled 2021-03-06 (×2): qty 2

## 2021-03-06 MED ORDER — ACETAMINOPHEN 160 MG/5ML PO SOLN
650.0000 mg | ORAL | Status: DC
  Administered 2021-03-06: 06:00:00 650 mg
  Filled 2021-03-06: qty 20.3

## 2021-03-06 MED ORDER — INSULIN ASPART 100 UNIT/ML IJ SOLN
0.0000 [IU] | INTRAMUSCULAR | Status: DC
  Administered 2021-03-06: 06:00:00 15 [IU] via SUBCUTANEOUS

## 2021-03-06 MED ORDER — LEVETIRACETAM IN NACL 500 MG/100ML IV SOLN
500.0000 mg | Freq: Two times a day (BID) | INTRAVENOUS | Status: DC
  Administered 2021-03-06 – 2021-03-07 (×3): 500 mg via INTRAVENOUS
  Filled 2021-03-06 (×3): qty 100

## 2021-03-06 MED ORDER — NOREPINEPHRINE BITARTRATE 1 MG/ML IV SOLN: INTRAVENOUS | Status: AC | PRN

## 2021-03-06 MED ORDER — HYDRALAZINE HCL 20 MG/ML IJ SOLN
10.0000 mg | INTRAMUSCULAR | Status: DC | PRN
  Administered 2021-03-07: 01:00:00 10 mg via INTRAVENOUS
  Filled 2021-03-06: qty 1

## 2021-03-06 NOTE — TOC Progression Note (Signed)
Transition of Care [facility]) - Initial/Assessment Note    Patient Details  Name: Kurt [name] MRN: [id number] Date of Birth: 1960-06-18  Transition of Care [facility]) CM/SW Contact:    Milinda Antis, LCSWA Phone Number: 03/08/2021, 10:47 AM  Clinical Narrative:                 CSW received a consult for advanced directives/ POA.  CSW messaged the chaplain to request that the meet with the patient/family to obtain this information.          Patient Goals and CMS Choice        Expected Discharge Plan and Services                                                Prior Living Arrangements/Services                       Activities of Daily Living      Permission Sought/Granted                  Emotional Assessment              Admission diagnosis:  Cardiac arrest 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[place])    Enlarged prostate 06/25/2020   Diabetes mellitus ([place]) 06/25/2020   Diabetic peripheral neuropathy ([place]) 06/25/2020   Hypercholesterolemia 06/25/2020   Left carotid artery stenosis 06/19/2020   Cecal polyp 10/22/2018   Hx of colonic polyps 10/22/2018   Change in bowel habit 10/22/2018   Fecal urgency 10/22/2018   RLQ abdominal pain 10/22/2018   CAROTID ARTERY STENOSIS 09/06/2009   TOBACCO USER 01/12/2009   DIZZINESS 12/06/2008   CAROTID BRUIT 12/06/2008   DYSPNEA 12/06/2008   CHEST PAIN UNSPECIFIED 12/06/2008   HYPERTENSION 12/04/2008   ATHEROSCLEROTIC VASCULAR DISEASE 12/04/2008   PEPTIC ULCER DISEASE 12/04/2008   PCP:  [name], MD Pharmacy:   [place] [place] [address] [place] [address] Phone: [phone] Fax: [phone]     Social Determinants of Health (SDOH) Interventions    Readmission Risk Interventions No flowsheet data found.

## 2021-03-06 NOTE — Progress Notes (Signed)
ANTICOAGULATION CONSULT NOTE - Initial Consult  Pharmacy Consult for Heparin  Indication:  Rule out PE  No Known Allergies    Vital Signs: Temp: 99.3 F (37.4 C) (12/21 1430) Temp Source: Core (12/21 0435) BP: 146/66 (12/21 1300) Pulse Rate: 108 (12/21 1300)  Heparin dosing weight: 88.6 kg  Labs: Recent Labs    03/12/2021 0309 02/24/2021 0313 02/24/2021 0341 02/28/2021 0602 03/05/2021 1349  HGB 12.1* 12.2* 11.2*  --   --   HCT 37.4* 36.0* 33.0*  --   --   PLT 317  --   --   --   --   LABPROT 13.7  --   --   --   --   INR 1.1  --   --   --   --   HEPARINUNFRC  --   --   --   --  <0.10*  CREATININE 2.42* 2.30*  --   --   --   TROPONINIHS 27*  --   --  54*  --      Estimated Creatinine Clearance: 37.4 mL/min (A) (by C-G formula based on SCr of 2.3 mg/dL (H)).   Medical History: Past Medical History:  Diagnosis Date   Carotid stenosis    moderate to severe bilateral carotid stenosis   Cecal polyp    large cecal polyp   Claudication (HCC)    Diabetes mellitus without complication (HCC)    Hemorrhoids 09/2018   History of colon polyps 09/2018   Hypertension    Obesity    Peripheral vascular disease ([place])    Stroke (HCC)    no residual     Assessment: 60 y/o M s/p cardiac arrest with ROSC. CCM ?PE as cause. Starting heparin until it can be ruled out. Labs above. PTA meds reviewed.   Heparin level <0.1 (on heparin 1400 units/hr)  Goal of Therapy:  Heparin level 0.3-0.7 units/ml Monitor platelets by anticoagulation protocol: Yes   Plan:  Will increase to heparin 1600 units/hr Heparin level @2100  Daily heparin level and CBC ordered Goals of care discussions ongoing  Thank you for allowing pharmacy to be a part of this patients care.  Donnald Garre, PharmD Clinical Pharmacist  Please check AMION for all [place] numbers After 10:00 PM, call [place] [phone]

## 2021-03-06 NOTE — Progress Notes (Signed)
NAME:  Kurt [name], MRN:  [id number], DOB:  1960-07-13, LOS: 0 ADMISSION DATE:  03/05/2021, CONSULTATION DATE: 12/21 REFERRING MD: Dr. Sedonia Small EDP, CHIEF COMPLAINT: Cardiac arrest  History of Present Illness:  60 year old male with past medical history as below, which is significant for poorly controlled diabetes, hypertension, CAD, stroke, and carotid stenosis status post recent left carotid endarterectomy.  The patient has had a very stressful past couple of weeks.  His daughter unfortunately passed away several weeks ago and his son suffered an overdose on 12/20 requiring the patient to perform CPR.  Later that night the patient was in his usual state of health when suddenly becoming unresponsive right in front of his wife.  She was concerned he was having a heart attack and gave him 3 sublingual nitroglycerin, called EMS, and started CPR.  She estimates about 10 minutes of her CPR prior to EMS arrival.  EMS reports 12 to 15 minutes of CPR themselves.  Upon arrival to the emergency department the patient was unresponsive and was intubated without medications.  He remained unresponsive.  Laboratory evaluation significant for potassium 5.3, glucose 434, creatinine 2.42, AST 206, troponin only 27, WBC 16.5.  He was profoundly hypotensive and was started on norepinephrine through a central line placed in the ED.  PCCM was asked to admit.  Pertinent  Medical History   has a past medical history of Carotid stenosis, Cecal polyp, Claudication ([place]), Diabetes mellitus without complication ([place]), Hemorrhoids (09/2018), History of colon polyps (09/2018), Hypertension, Obesity, Peripheral vascular disease ([place]), and Stroke ([place]).   Significant Hospital Events: Including procedures, antibiotic start and stop dates in addition to other pertinent events     Interim History / Subjective:  Patient remains unresponsive, family at bedside He had witnessed seizure while transferring to ICU bed  Objective    Blood pressure 120/68, pulse (!) 102, temperature 98.8 F (37.1 C), resp. rate 18, height 5\' 8"  (1.727 m), weight 95.7 kg, SpO2 100 %.    Vent Mode: PRVC FiO2 (%):  [80 %-100 %] 80 % Set Rate:  [20 bmp] 20 bmp Vt Set:  [550 mL] 550 mL PEEP:  [5 cmH20] 5 cmH20 Plateau Pressure:  [19 cmH20] 19 cmH20   Intake/Output Summary (Last 24 hours) at 02/20/2021 1156 Last data filed at 03/02/2021 1139 Gross per 24 hour  Intake 1178.27 ml  Output 900 ml  Net 278.27 ml   Filed Weights   02/14/2021 0547  Weight: 95.7 kg    Examination: General: Crtitically ill-appearing male, orally intubated HEENT: [place]/AT, eyes anicteric.  ETT and OGT in place Neuro: Eyes closed, does not open, not following commands.  His pupils dilated and fixed bilaterally, absent cough, gag, corneal reflexes.  He is breathing over the vent Chest: Coarse breath sounds, no wheezes or rhonchi Heart: Regular rate and rhythm, no murmurs or gallops Abdomen: Soft, nontender, nondistended, bowel sounds present Skin: No rash  Resolved Hospital Problem list     Assessment & Plan:  S/p PEA cardiac arrest: Etiology unclear Differentials could be Takotsubo, massive PE versus acute MI Echocardiogram is pending, cannot get CT angiogram of the chest due to hemodynamic instability and acute renal failure Started on IV heparin infusion empirically Serum troponins remain low at 54 Continue normothermia protocol Neuroprotective measures  Witnessed seizures Patient was noted to have witnessed seizure while transferring from emergency bed to ICU where This could be related to myoclonus Continue Keppra Patient is being hooked up to video EEG Continue propofol  Acute  hypoxic/hypercarbic respiratory failure Continue lung protective ventilation Vent setting was adjusted to clear hypercapnia Trend ABGs  Acute anoxic encephalopathy Diffuse cerebral edema Patient's neurological exam and head CT is consistent with anoxic injury.   Continue EEG Continue supportive care for now  Shock was ruled out Hypotension due to sedation Once sedation was decreased, patient came off of Levophed Closely monitor vital signs, with map goal 65  Poorly controlled diabetes with hyperglycemia Patient blood sugars are running in 400s Started on IV insulin infusion CBG goal 140-180  Hyponatremia/hyperkalemia Acute kidney injury Lactic acidosis Acute kidney injury from ischemic ATN likely due to cardiac arrest Continue IV fluid resuscitation Closely monitor electrolytes and supplement Trend lactate  HTN, HLD: Hold home amlodipine, statin, benazepril for now.   COVID positive without respiratory symptoms: was asymptomatic. Diagnosed initial positive test 12/16 Supportive care  Best Practice (right click and "Reselect all SmartList Selections" daily)   Diet/type: NPO DVT prophylaxis: systemic heparin GI prophylaxis: PPI Lines: Central line Foley:  Yes, and it is still needed Code Status: DNR Last date of multidisciplinary goals of care discussion [12/21: Patient's wife and brother were updated at bedside, decision was to keep him DNR and continue full scope of care for now]  Labs   CBC: Recent Labs  Lab 03/10/2021 0309 02/18/2021 0313 03/03/2021 0341  WBC 16.5*  --   --   HGB 12.1* 12.2* 11.2*  HCT 37.4* 36.0* 33.0*  MCV 91.7  --   --   PLT 317  --   --     Basic Metabolic Panel: Recent Labs  Lab 03/08/2021 0309 02/22/2021 0313 02/22/2021 0341  NA 133* 134* 136  K 5.3* 5.3* 4.4  CL 97* 98  --   CO2 20*  --   --   GLUCOSE 434* 433*  --   BUN 19 21  --   CREATININE 2.42* 2.30*  --   CALCIUM 8.0*  --   --    GFR: Estimated Creatinine Clearance: 37.4 mL/min (A) (by C-G formula based on SCr of 2.3 mg/dL (H)). Recent Labs  Lab 03/05/2021 0309 02/19/2021 0404  WBC 16.5*  --   LATICACIDVEN  --  4.5*    Liver Function Tests: Recent Labs  Lab 02/22/2021 0309  AST 206*  ALT 126*  ALKPHOS 176*  BILITOT 0.7  PROT  6.3*  ALBUMIN 3.0*   No results for input(s): LIPASE, AMYLASE in the last 168 hours. No results for input(s): AMMONIA in the last 168 hours.  ABG    Component Value Date/Time   PHART 7.243 (L) 03/04/2021 0341   PCO2ART 49.3 (H) 02/17/2021 0341   PO2ART 111 (H) 03/05/2021 0341   HCO3 21.6 03/14/2021 0341   TCO2 23 02/17/2021 0341   ACIDBASEDEF 6.0 (H) 03/08/2021 0341   O2SAT 98.0 03/08/2021 0341     Coagulation Profile: Recent Labs  Lab 02/26/2021 0309  INR 1.1    Cardiac Enzymes: No results for input(s): CKTOTAL, CKMB, CKMBINDEX, TROPONINI in the last 168 hours.  HbA1C: Hgb A1c MFr Bld  Date/Time Value Ref Range Status  02/23/2021 03:10 AM 10.3 (H) 4.8 - 5.6 % Final    Comment:    (NOTE) Pre diabetes:          5.7%-6.4%  Diabetes:              >6.4%  Glycemic control for   <7.0% adults with diabetes   06/19/2020 02:38 PM 9.6 (H) 4.8 - 5.6 % Final  Comment:    (NOTE) Pre diabetes:          5.7%-6.4%  Diabetes:              >6.4%  Glycemic control for   <7.0% adults with diabetes     CBG: Recent Labs  Lab 02/21/2021 0540 02/14/2021 0904 03/12/2021 1000 03/04/2021 1110  GLUCAP 387* 428* 480* 461*    Critical care time:     Total critical care time: 37 minutes  Performed by: [place] care time was exclusive of separately billable procedures and treating other patients.   Critical care was necessary to treat or prevent imminent or life-threatening deterioration.   Critical care was time spent personally by me on the following activities: development of treatment plan with patient and/or surrogate as well as nursing, discussions with consultants, evaluation of patient's response to treatment, examination of patient, obtaining history from patient or surrogate, ordering and performing treatments and interventions, ordering and review of laboratory studies, ordering and review of radiographic studies, pulse oximetry and re-evaluation of  patient's condition.   Jacky Kindle MD [facility] See Amion for pager If no response to pager, please call [phone] until 7pm After 7pm, Please call E-link [phone]

## 2021-03-06 NOTE — Progress Notes (Signed)
EEG complete - results pending 

## 2021-03-06 NOTE — Progress Notes (Signed)
ANTICOAGULATION CONSULT NOTE  Pharmacy Consult for Heparin  Indication:  Rule out PE  No Known Allergies  Vital Signs: Temp: 98.8 F (37.1 C) (12/21 2100) Temp Source: Core (12/21 2000) BP: 161/71 (12/21 2100) Pulse Rate: 96 (12/21 2100) Heparin dosing weight: 88.6 kg  Labs: Recent Labs    03/01/2021 0309 03/09/2021 0313 03/10/2021 0341 03/09/2021 0602 03/14/2021 1349 03/05/2021 2113  HGB 12.1* 12.2* 11.2*  --   --   --   HCT 37.4* 36.0* 33.0*  --   --   --   PLT 317  --   --   --   --   --   LABPROT 13.7  --   --   --   --   --   INR 1.1  --   --   --   --   --   HEPARINUNFRC  --   --   --   --  <0.10* <0.10*  CREATININE 2.42* 2.30*  --   --   --   --   TROPONINIHS 27*  --   --  54*  --   --      Estimated Creatinine Clearance: 37.4 mL/min (A) (by C-G formula based on SCr of 2.3 mg/dL (H)).   Assessment: 60 y/o M s/p cardiac arrest with ROSC. CCM ?PE as cause. Starting heparin until it can be ruled out. Doppler negative for DVT.  Heparin level remains undetectable.  No issue with heparin infusion nor bleeding per RN.  Goal of Therapy:  Heparin level 0.3-0.7 units/ml Monitor platelets by anticoagulation protocol: Yes   Plan:  Increase heparin infusion to 1950 units/hr Check 6 hr heparin level  Mattison Golay D. Mina Marble, PharmD, BCPS, [place] 03/09/2021, 9:58 PM

## 2021-03-06 NOTE — Progress Notes (Signed)
Pt transported via vent to 3M03. No complications noted. April White RRT, notified that pt has been transferred.

## 2021-03-06 NOTE — Progress Notes (Signed)
[facility] Progress Note Patient Name: RADIN RAPTIS DOB: [birth year] MRN: [id number]   Date of Service  03/04/2021  HPI/Events of Note  Hypertension - BP = 159/77.   eICU Interventions  Plan: Hydralazine 10 mg IV Q 4 hours PRN SBP > 170 or DBP > 100.     Intervention Category Major Interventions: Hypertension - evaluation and management  Arieon Scalzo [place] 02/21/2021, 11:54 PM
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
## 2021-03-06 NOTE — Progress Notes (Signed)
Made MD aware that there were no maintenance fluids and that [facility] wanted me to transition to dextrose and LR. At this time MD does not want to start any fluids.

## 2021-03-06 NOTE — Progress Notes (Signed)
[facility] reccomends considering transitioning off insulin gtt. Per Dr Madalyn Rob no transition at this time.

## 2021-03-06 NOTE — ED Notes (Signed)
Paged CC to notify CBG value outside medication parameters.

## 2021-03-06 NOTE — Procedures (Signed)
Patient Name: Kurt [name]  MRN: [id number]  Epilepsy Attending: Lora Havens  Referring Physician/Provider: Corey Harold, NP Date: 03/01/2021 Duration: 22.34 mins  Patient history: 60 year old male status post cardiac arrest, noted to have seizure-like activity while transferring emergency room to ICU.  EEG to evaluate for seizure.  Level of alertness: comatose  AEDs during EEG study: LEV, propofol  Technical aspects: This EEG study was done with scalp electrodes positioned according to the 10-20 International system of electrode placement. Electrical activity was acquired at a sampling rate of 500Hz  and reviewed with a high frequency filter of 70Hz  and a low frequency filter of 1Hz . EEG data were recorded continuously and digitally stored.   Description: EEG showed burst suppression pattern with bursts are prior amplitude sharply contoured 5 to 6 Hz theta slowing lasting 1 to 2 seconds alternating with generalized EEG suppression lasting about 2 minutes.  EEG was not reactive to noxious stimulation. Hyperventilation and photic stimulation were not performed.     ABNORMALITY -Burst suppression, generalized  IMPRESSION: This study is suggestive of profound diffuse encephalopathy, nonspecific to etiology but could be secondary to sedation, anoxic/hypoxic brain injury.  No definite seizures were seen during this time.  Jackilyn Umphlett Barbra Sarks

## 2021-03-06 NOTE — Progress Notes (Signed)
ANTICOAGULATION CONSULT NOTE - Initial Consult  Pharmacy Consult for Heparin  Indication:  Rule out PE  No Known Allergies    Vital Signs: Temp: 94 F (34.4 C) (12/21 0500) Temp Source: Core (12/21 0435) BP: 167/85 (12/21 0500) Pulse Rate: 93 (12/21 0500)  Labs: Recent Labs    02/15/2021 0309 02/21/2021 0313 02/28/2021 0341  HGB 12.1* 12.2* 11.2*  HCT 37.4* 36.0* 33.0*  PLT 317  --   --   LABPROT 13.7  --   --   INR 1.1  --   --   CREATININE 2.42* 2.30*  --   TROPONINIHS 27*  --   --     CrCl cannot be calculated (Unknown ideal weight.).   Medical History: Past Medical History:  Diagnosis Date   Carotid stenosis    moderate to severe bilateral carotid stenosis   Cecal polyp    large cecal polyp   Claudication ([facility])    Diabetes mellitus without complication ([facility])    Hemorrhoids 09/2018   History of colon polyps 09/2018   Hypertension    Obesity    Peripheral vascular disease ([place])    Stroke ([facility])    no residual     Assessment: 60 y/o M s/p cardiac arrest with ROSC. CCM ?PE as cause. Starting heparin until it can be ruled out. Labs above. PTA meds reviewed.   Goal of Therapy:  Heparin level 0.3-0.7 units/ml Monitor platelets by anticoagulation protocol: Yes   Plan:  No bolus with 30 minutes of CPR Start heparin drip at 1400 units/hr 8 hour heparin level  Narda Bonds, PharmD, [place] Pharmacist Phone: [phone]

## 2021-03-06 NOTE — ED Notes (Signed)
CCM Provider at bedside.

## 2021-03-06 NOTE — Progress Notes (Addendum)
Lower extremity venous bilateral study completed.   Please see CV Proc for preliminary results.   Ayrabella Labombard, RDMS, RVT  

## 2021-03-06 NOTE — Care Plan (Signed)
GOALS OF CARE DISCUSSION   The Clinical status was relayed to patient's wife and his brother at bedside in detail.   Updated and notified of patients medical condition.     Patient remains unresponsive and will not open eyes to command.   Patient with absent cough and struggling to remove secretions.   Patient with increased WOB and using accessory muscles to breathe Explained to family course of therapy and the modalities  Signs of brain damage Evidence of kidney failure   Patient with Progressive multiorgan failure with a very high probablity of a very minimal chance of meaningful recovery despite all aggressive and optimal medical therapy.  Patient's wife decided to keep him DNR and continue full scope of care for now     Family are satisfied with Plan of action and management. All questions answered     Jacky Kindle MD  Pulmonary Critical Care See Amion for pager If no response to pager, please call [phone] until 7pm After 7pm, Please call E-link [phone]

## 2021-03-06 NOTE — Progress Notes (Signed)
Attempted advancing current OG tube, with no success. Then attempted a second new OG tube and advance it until I met resistance. X-ray showed that it does need further advancement.

## 2021-03-06 NOTE — ED Notes (Signed)
CC paged to RN per her request
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
## 2021-03-06 NOTE — Progress Notes (Signed)
Started cEEG study.  Notified [facility] monitoring.  Tested patient event button. 
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
## 2021-03-06 NOTE — ED Notes (Signed)
Patient transported to CT 

## 2021-03-07 ENCOUNTER — Inpatient Hospital Stay (HOSPITAL_COMMUNITY): Payer: Medicare Other

## 2021-03-07 DIAGNOSIS — J9601 Acute respiratory failure with hypoxia: Secondary | ICD-10-CM

## 2021-03-07 DIAGNOSIS — J9602 Acute respiratory failure with hypercapnia: Secondary | ICD-10-CM

## 2021-03-07 DIAGNOSIS — I469 Cardiac arrest, cause unspecified: Secondary | ICD-10-CM

## 2021-03-07 DIAGNOSIS — N179 Acute kidney failure, unspecified: Secondary | ICD-10-CM

## 2021-03-07 LAB — BASIC METABOLIC PANEL
Anion gap: 14 (ref 5–15)
BUN: 42 mg/dL — ABNORMAL HIGH (ref 8–23)
CO2: 21 mmol/L — ABNORMAL LOW (ref 22–32)
Calcium: 7.8 mg/dL — ABNORMAL LOW (ref 8.9–10.3)
Chloride: 101 mmol/L (ref 98–111)
Creatinine, Ser: 2.42 mg/dL — ABNORMAL HIGH (ref 0.61–1.24)
GFR, Estimated: 29 mL/min — ABNORMAL LOW (ref >60)
Glucose, Bld: 167 mg/dL — ABNORMAL HIGH (ref 70–99)
Potassium: 4 mmol/L (ref 3.5–5.1)
Sodium: 136 mmol/L (ref 135–145)

## 2021-03-07 LAB — CBC
HCT: 41.1 % (ref 39.0–52.0)
Hemoglobin: 13.7 g/dL (ref 13.0–17.0)
MCH: 28.8 pg (ref 26.0–34.0)
MCHC: 33.3 g/dL (ref 30.0–36.0)
MCV: 86.5 fL (ref 80.0–100.0)
Platelets: 367 10*3/uL (ref 150–400)
RBC: 4.75 MIL/uL (ref 4.22–5.81)
RDW: 13.4 % (ref 11.5–15.5)
WBC: 22.8 10*3/uL — ABNORMAL HIGH (ref 4.0–10.5)
nRBC: 0 % (ref 0.0–0.2)

## 2021-03-07 LAB — ECHOCARDIOGRAM LIMITED
Area-P 1/2: 3.74 cm2
Calc EF: 53.3 %
Height: 68 in
S' Lateral: 3.1 cm
Single Plane A2C EF: 53.6 %
Single Plane A4C EF: 51.7 %
Weight: 3375.68 oz

## 2021-03-07 LAB — PHOSPHORUS: Phosphorus: 3.6 mg/dL (ref 2.5–4.6)

## 2021-03-07 LAB — MAGNESIUM: Magnesium: 1.6 mg/dL — ABNORMAL LOW (ref 1.7–2.4)

## 2021-03-07 LAB — GLUCOSE, CAPILLARY
Glucose-Capillary: 100 mg/dL — ABNORMAL HIGH (ref 70–99)
Glucose-Capillary: 112 mg/dL — ABNORMAL HIGH (ref 70–99)
Glucose-Capillary: 124 mg/dL — ABNORMAL HIGH (ref 70–99)
Glucose-Capillary: 129 mg/dL — ABNORMAL HIGH (ref 70–99)
Glucose-Capillary: 144 mg/dL — ABNORMAL HIGH (ref 70–99)
Glucose-Capillary: 145 mg/dL — ABNORMAL HIGH (ref 70–99)
Glucose-Capillary: 158 mg/dL — ABNORMAL HIGH (ref 70–99)
Glucose-Capillary: 163 mg/dL — ABNORMAL HIGH (ref 70–99)
Glucose-Capillary: 169 mg/dL — ABNORMAL HIGH (ref 70–99)

## 2021-03-07 LAB — TRIGLYCERIDES: Triglycerides: 114 mg/dL (ref <150)

## 2021-03-07 LAB — HEPARIN LEVEL (UNFRACTIONATED): Heparin Unfractionated: 1.09 IU/mL — ABNORMAL HIGH (ref 0.30–0.70)

## 2021-03-07 MED ORDER — GLYCOPYRROLATE 0.2 MG/ML IJ SOLN: 0.2000 mg | INTRAMUSCULAR | Status: DC | PRN

## 2021-03-07 MED ORDER — ACETAMINOPHEN 650 MG RE SUPP: 650.0000 mg | Freq: Three times a day (TID) | RECTAL | Status: DC | PRN

## 2021-03-07 MED ORDER — MORPHINE 100MG IN NS 100ML (1MG/ML) PREMIX INFUSION
0.0000 mg/h | INTRAVENOUS | Status: DC
  Administered 2021-03-07: 18:00:00 5 mg/h via INTRAVENOUS
  Filled 2021-03-07: qty 100

## 2021-03-07 MED ORDER — LABETALOL HCL 5 MG/ML IV SOLN
10.0000 mg | INTRAVENOUS | Status: DC | PRN
  Administered 2021-03-07: 11:00:00 10 mg via INTRAVENOUS
  Filled 2021-03-07: qty 4

## 2021-03-07 MED ORDER — ACETAMINOPHEN 325 MG PO TABS: 650.0000 mg | ORAL_TABLET | Freq: Four times a day (QID) | ORAL | Status: DC | PRN

## 2021-03-07 MED ORDER — MAGNESIUM SULFATE 4 GM/100ML IV SOLN
4.0000 g | Freq: Once | INTRAVENOUS | Status: AC
  Administered 2021-03-07: 12:00:00 4 g via INTRAVENOUS
  Filled 2021-03-07: qty 100

## 2021-03-07 MED ORDER — LORAZEPAM 2 MG/ML IJ SOLN
2.0000 mg | INTRAMUSCULAR | Status: DC | PRN
  Administered 2021-03-07: 18:00:00 4 mg via INTRAVENOUS
  Filled 2021-03-07: qty 2

## 2021-03-07 MED ORDER — GLYCOPYRROLATE 1 MG PO TABS: 1.0000 mg | ORAL_TABLET | ORAL | Status: DC | PRN

## 2021-03-07 MED ORDER — POLYVINYL ALCOHOL 1.4 % OP SOLN: 1.0000 [drp] | Freq: Four times a day (QID) | OPHTHALMIC | Status: DC | PRN

## 2021-03-07 MED ORDER — INSULIN DETEMIR 100 UNIT/ML ~~LOC~~ SOLN
8.0000 [IU] | Freq: Two times a day (BID) | SUBCUTANEOUS | Status: DC
  Administered 2021-03-07: 11:00:00 8 [IU] via SUBCUTANEOUS
  Filled 2021-03-07 (×3): qty 0.08

## 2021-03-07 MED ORDER — ACETAMINOPHEN 650 MG RE SUPP: 650.0000 mg | Freq: Four times a day (QID) | RECTAL | Status: DC | PRN

## 2021-03-07 MED ORDER — DIPHENHYDRAMINE HCL 50 MG/ML IJ SOLN: 25.0000 mg | INTRAMUSCULAR | Status: DC | PRN

## 2021-03-07 MED ORDER — MORPHINE SULFATE (PF) 2 MG/ML IV SOLN: 2.0000 mg | INTRAVENOUS | Status: DC | PRN

## 2021-03-07 MED ORDER — CEFTRIAXONE SODIUM 2 G IJ SOLR
2.0000 g | INTRAMUSCULAR | Status: DC
  Administered 2021-03-07: 11:00:00 2 g via INTRAVENOUS
  Filled 2021-03-07: qty 20

## 2021-03-07 MED ORDER — HEPARIN (PORCINE) 25000 UT/250ML-% IV SOLN
1700.0000 [IU]/h | INTRAVENOUS | Status: DC
Start: 2021-03-07 — End: 2021-03-07
  Administered 2021-03-07: 14:00:00 1700 [IU]/h via INTRAVENOUS
  Filled 2021-03-07 (×2): qty 250

## 2021-03-07 MED ORDER — MORPHINE BOLUS VIA INFUSION
5.0000 mg | INTRAVENOUS | Status: DC | PRN
  Administered 2021-03-07 (×3): 5 mg via INTRAVENOUS
  Filled 2021-03-07: qty 5

## 2021-03-07 MED ORDER — INSULIN ASPART 100 UNIT/ML IJ SOLN
1.0000 [IU] | INTRAMUSCULAR | Status: DC
Start: 2021-03-07 — End: 2021-03-07

## 2021-03-17 NOTE — Progress Notes (Signed)
vLTM discontinue  Patient expired  [facility] notified

## 2021-03-17 NOTE — Procedures (Addendum)
Patient Name: Kurt [name]  MRN: [id number]  Epilepsy Attending: Lora Havens  Referring Physician/Provider: Corey Harold, NP Duration: 02/23/2021 1143 to 27-Mar-2021 1145   Patient history: 61 year old male status post cardiac arrest, noted to have seizure-like activity while transferring emergency room to ICU.  EEG to evaluate for seizure.   Level of alertness: comatose   AEDs during EEG study: LEV   Technical aspects: This EEG study was done with scalp electrodes positioned according to the 10-20 International system of electrode placement. Electrical activity was acquired at a sampling rate of 500Hz  and reviewed with a high frequency filter of 70Hz  and a low frequency filter of 1Hz . EEG data were recorded continuously and digitally stored.    Description: EEG showed burst suppression pattern with bursts are prior amplitude sharply contoured 5 to 6 Hz theta slowing lasting 1 to 2 seconds alternating with generalized EEG suppression lasting about 2 minutes.  EEG was not reactive to noxious stimulation. Hyperventilation and photic stimulation were not performed.      ABNORMALITY -Burst suppression, generalized   IMPRESSION: This study is suggestive of profound diffuse encephalopathy, nonspecific to etiology but could be secondary to sedation, anoxic/hypoxic brain injury.  No definite seizures were seen during this time.   Erez Mccallum Barbra Sarks

## 2021-03-17 NOTE — Death Summary Note (Signed)
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 comprehensive MRSA colonization surveillance program. It is not intended to diagnose MRSA infection nor to guide or monitor treatment for MRSA infections. Test performance is not FDA approved in patients less than 66 years old. Performed at [facility], [place] [address] [place], [place] [address]     Lab Basic Metabolic Panel: Recent Labs  Lab 02/27/2021 0309 02/20/2021 0313 03/05/2021 0341 03/19/2021 0353  NA 133* 134* 136 136  K 5.3* 5.3* 4.4 4.0  CL 97* 98  --  101  CO2 20*  --   --  21*  GLUCOSE 434* 433*  --  167*  BUN 19 21  --  42*  CREATININE 2.42* 2.30*  --  2.42*  CALCIUM 8.0*  --   --  7.8*  MG  --   --   --  1.6*  PHOS  --   --   --  3.6   Liver Function Tests: Recent Labs  Lab 02/22/2021 0309  AST 206*  ALT 126*  ALKPHOS 176*  BILITOT 0.7  PROT 6.3*  ALBUMIN 3.0*   No results for input(s): LIPASE, AMYLASE in the last 168 hours. No results for input(s): AMMONIA in the last 168 hours. CBC: Recent Labs  Lab 02/18/2021 0309 03/14/2021 0313 02/16/2021 0341 2021/03/19 0353  WBC 16.5*  --   --  22.8*  HGB  12.1* 12.2* 11.2* 13.7  HCT 37.4* 36.0* 33.0* 41.1  MCV 91.7  --   --  86.5  PLT 317  --   --  367   Cardiac Enzymes: No results for input(s): CKTOTAL, CKMB, CKMBINDEX, TROPONINI in the last 168 hours. Sepsis Labs: Recent Labs  Lab 02/28/2021 0309 02/23/2021 0404 03/19/2021 0353  WBC 16.5*  --  22.8*  LATICACIDVEN  --  4.5*  --     Procedures/Operations     [employer] 03/08/2021, 8:54 AM

## 2021-03-17 NOTE — Progress Notes (Signed)
EEG maintenance performed.  No skin breakdown observed at electrode sites Fp1, Fp2. 

## 2021-03-17 NOTE — Progress Notes (Signed)
Pt transported to and from CT scan on the ventilator. 

## 2021-03-17 NOTE — Procedures (Signed)
Extubation Procedure Note  Patient Details:   Name: Kurt [name] DOB: 1961/02/20 MRN: [id number]   Airway Documentation:    Vent end date: Mar 28, 2021 Vent end time: 1914   Evaluation  O2 sats: currently acceptable Complications: No apparent complications Patient did tolerate procedure well. Bilateral Breath Sounds: Rhonchi, Diminished   No Pt extubated to comfort care.  Roderick Pee March 28, 2021, 8:42 PM

## 2021-03-17 NOTE — Progress Notes (Signed)
Chaplain responded to page for support of pt's wife. Chaplain offered ministry of presence with wife as she told of having buried her daughter two months ago from a drug overdose, her son being on suicide watch in prison because he overdosed yesterday.  Her husband said he just couldn't take it any more and dropped to the floor with a heart attack and is now brain dead.  Chaplain prayed with wife. Chaplain will refer to day Chaplain.  [place]

## 2021-03-17 NOTE — Progress Notes (Signed)
°   2021/03/14 0900  Clinical Encounter Type  Visited With Family  Visit Type Follow-up;Psychological support;Spiritual support;Critical Care  Referral From Chaplain  Consult/Referral To Chaplain  Spiritual Encounters  Spiritual Needs Emotional;Grief support   [place] visited with pt.'s wife Deanna per referral for follow-up from [facility].  Deanna shared details already noted by Gwendel Hanson re: recent deaths and overdoses in family.  Deanna says she had been hoping pt. would wake up but this AM had a sense that he is "already gone."  Deanna has background in healthcare and acknowledged that the signs and symptoms pt. is exhibiting suggest a very poor prognosis.  Deanna says her faith in God has been a major source of strength and support these past two days and throughout the difficulties of the past few years.  She appreciates chaplains' support and availability.  No further needs expressed at this time.  Lindaann Pascal, Chaplain Pager: [phone]

## 2021-03-17 NOTE — Procedures (Signed)
Patient Name: Kurt [name]  MRN: [id number]  Epilepsy Attending: Lora Havens  Referring Physician/Provider: Corey Harold, NP Duration: 2021/03/22 1143 to 03-22-2021 1930   Patient history: 61 year old male status post cardiac arrest, noted to have seizure-like activity while transferring emergency room to ICU.  EEG to evaluate for seizure.   Level of alertness: comatose   AEDs during EEG study: LEV   Technical aspects: This EEG study was done with scalp electrodes positioned according to the 10-20 International system of electrode placement. Electrical activity was acquired at a sampling rate of 500Hz  and reviewed with a high frequency filter of 70Hz  and a low frequency filter of 1Hz . EEG data were recorded continuously and digitally stored.    Description: EEG showed continuous generalized background suppression. EEG was not reactive to noxious stimulation.  After around 1929 on 03/22/21, EKG showed bradycardia which eventually progressed to asystole.  Hyperventilation and photic stimulation were not performed.      ABNORMALITY -Background suppression, generalized   IMPRESSION: This study was initially suggestive of profound diffuse encephalopathy, nonspecific to etiology but could be secondary to sedation, anoxic/hypoxic brain injury.  No definite seizures were seen during this time. After around 1929 on 22-Mar-2021, EKG showed bradycardia which eventually progressed to asystole.   Tarea Skillman Barbra Sarks

## 2021-03-17 NOTE — Progress Notes (Addendum)
ANTICOAGULATION CONSULT NOTE  Pharmacy Consult for Heparin  Indication:  Rule out PE  No Known Allergies  Vital Signs: Temp: 98.6 F (37 C) (12/22 0400) Temp Source: Core (12/22 0400) BP: 155/81 (12/22 0400) Pulse Rate: 109 (12/22 0400) Heparin dosing weight: 88.6 kg  Labs: Recent Labs    02/18/2021 0309 03/04/2021 0313 03/10/2021 0341 02/28/2021 0602 03/09/2021 1349 02/21/2021 2113 March 08, 2021 0353  HGB 12.1* 12.2* 11.2*  --   --   --  13.7  HCT 37.4* 36.0* 33.0*  --   --   --  41.1  PLT 317  --   --   --   --   --  367  LABPROT 13.7  --   --   --   --   --   --   INR 1.1  --   --   --   --   --   --   HEPARINUNFRC  --   --   --   --  <0.10* <0.10* 1.09*  CREATININE 2.42* 2.30*  --   --   --   --   --   TROPONINIHS 27*  --   --  54*  --   --   --      Estimated Creatinine Clearance: 37.4 mL/min (A) (by C-G formula based on SCr of 2.3 mg/dL (H)).   Assessment: 61 y/o M s/p cardiac arrest with ROSC. CCM ?PE as cause. Starting heparin until it can be ruled out. Doppler negative for DVT.  Heparin level up to 1.06 (supratherapeutic) on heparin infusion at 1950 units/hr. RN reports that heparin line was switched to a different line after 2 undetectable levels so likely other line wasn't running correctly. This level was drawn from femoral line and heparin running in L arm. No bleeding noted.  Goal of Therapy:  Heparin level 0.3-0.7 units/ml Monitor platelets by anticoagulation protocol: Yes   Plan:  Hold heparin x 1 hour Decrease heparin infusion to 1700 units/hr Check 8 hr heparin level  Sherlon Handing, PharmD, BCPS Please see amion for complete clinical pharmacist phone list 03/08/21, 5:22 AM

## 2021-03-17 NOTE — Significant Event (Addendum)
°  Interdisciplinary Goals of Care Family Meeting   Date carried out:: 2021/03/09  Location of the meeting: Conference room  Member's involved: Nurse Practitioner, Bedside Registered Nurse, and Family Member or next of kin  Durable Power of Attorney or acting medical decision maker: wife, Kurt [name]  Discussion: We discussed goals of care for Kurt [name] .  Patient returned from repeat [facility] which showed marked progression of cerebral edema c/w devastating neurological anoxic injury, in additional to ongoing MODS.  Chance for meaningful neurologic recovery is grim and likely patient would progress to herniation and eventual brain death despite ongoing care.  Wife states that she does not want him to suffer anymore.  Remaining family in the room agree.  Ongoing family support offered.  Will page chaplin.  Family are saying their goodbyes and then wish to transition to comfort care and compassionate extubation now.   Will start morphine gtt, and have ativan and robinul prn.  Will continue keppra for myoclonus prevention.    Code status: Full DNR  Disposition: In-patient comfort care  Time spent for the meeting: 25 mins      Kennieth Rad, ACNP [facility] 2021-03-09, 5:28 PM  See Amion for pager If no response to pager, please call PCCM consult pager After 7:00 pm call Elink

## 2021-03-17 NOTE — Progress Notes (Signed)
NAME:  Kurt [name], MRN:  [id number], DOB:  02-11-1961, LOS: 1 ADMISSION DATE:  03/12/2021, CONSULTATION DATE: 12/21 REFERRING MD: Dr. Sedonia Small EDP, CHIEF COMPLAINT: Cardiac arrest  History of Present Illness:  61 year old male with past medical history as below, which is significant for poorly controlled diabetes, hypertension, CAD, stroke, and carotid stenosis status post recent left carotid endarterectomy.  The patient has had a very stressful past couple of weeks.  His daughter unfortunately passed away several weeks ago and his son suffered an overdose on 12/20 requiring the patient to perform CPR.  Later that night the patient was in his usual state of health when suddenly becoming unresponsive right in front of his wife.  She was concerned he was having a heart attack and gave him 3 sublingual nitroglycerin, called EMS, and started CPR.  She estimates about 10 minutes of her CPR prior to EMS arrival.  EMS reports 12 to 15 minutes of CPR themselves.  Upon arrival to the emergency department the patient was unresponsive and was intubated without medications.  He remained unresponsive.  Laboratory evaluation significant for potassium 5.3, glucose 434, creatinine 2.42, AST 206, troponin only 27, WBC 16.5.  He was profoundly hypotensive and was started on norepinephrine through a central line placed in the ED.  PCCM was asked to admit.  Pertinent  Medical History   has a past medical history of Carotid stenosis, Cecal polyp, Claudication ([place]), Diabetes mellitus without complication ([place]), Hemorrhoids (09/2018), History of colon polyps (09/2018), Hypertension, Obesity, Peripheral vascular disease ([place]), and Stroke ([place]).   Significant Hospital Events: Including procedures, antibiotic start and stop dates in addition to other pertinent events   12/21 admitted post cardiac arrest, CTH with changes , witnessed seizure, on EEG/ keppra started, code status changed to DNR  Interim History /  Subjective:  Tmax 101.1- remains on arctic sun and prn tylenol Remain off sedation, no changes in neuro RN reports occasional facial and LUE twitching Poor UOP, 138ml over last 12 hrs, sCr 2.3 > 2.42 Now on cEEG  Objective   Blood pressure (!) 156/75, pulse (!) 110, temperature 98.8 F (37.1 C), resp. rate (!) 29, height 5\' 8"  (1.727 m), weight 95.7 kg, SpO2 98 %.    Vent Mode: PRVC FiO2 (%):  [40 %-60 %] 40 % Set Rate:  [20 bmp] 20 bmp Vt Set:  [550 mL] 550 mL PEEP:  [5 cmH20] 5 cmH20 Plateau Pressure:  [9 cmH20-10 cmH20] 10 cmH20   Intake/Output Summary (Last 24 hours) at 03/15/2021 1131 Last data filed at March 15, 2021 1042 Gross per 24 hour  Intake 631.92 ml  Output 1240 ml  Net -608.08 ml   Filed Weights   03/04/2021 0547 03/02/2021 1400  Weight: 95.7 kg 95.7 kg   Examination: General:  critically ill adult male in NAD, remains on arctic sun pads HEENT: MM pink/moist, ETT, pupils 5 fixed, absent corneal's and gag, copious oral secretions Neuro: unresponsive to noxious stimuli, no observed myoclonus,  CV: ST, distant heart sounds, femoral CVL  PULM:  full MV support, irregular breathing pattern, some vent dyssynchrony at times, thick tan secretions, no cough, diffuse rhonchi anteriorly, limited due to [employer] sun pads GI: obese, few BS, foley Extremities: warm/dry, generalized mild edema Skin: no rashes   Labs reviewed.  No CXR.   Resolved Hospital Problem list     Assessment & Plan:  S/p PEA cardiac arrest: Etiology remains unclear Differentials could be Takotsubo, massive PE versus MI - TTE pending, LE dopplers  negative for DVT.  If RV ok on TTE, doubtful for PE and can d/c heparin gtt.  Continue for now per pharmacy pending TTE - off pressors, now more hypertensive.  Will change prn hydralazine to labetalol given cerebral edema.   - troponin hs trend flat, 27 > 54.  Initial EKG with diffuse STD, expected after prolonged cardiac arrest.  Can repeat trop hs today.   Awaiting TTE, would need neuro recovery prior to consideration of further cardiac intervention - Continue normothermia protocol - continue Neuroprotective measures  Witnessed rhythmic activity, concerning for seizure vs myoclonus Patient was noted to have witnessed seizure while transferring from emergency bed to ICU  - EEG negative for seizure activity, c/w with diffuse encephalopathy - remains on cEEG for now - continue keppra for now, no evidence of myoclonus thus far today  Acute hypoxic/hypercarbic respiratory failure Suspected aspiration  - Continue MV support,PRVC 8cc/kg IBW with goal Pplat <30 and DP<15  - VAP prevention protocol/ PPI - PAD protocol for sedation> currently not needed, prn propofol and fentnayl (not sedation since 12/21) - wean FiO2 as able for SpO2 >92%  - does not meet criteria for SBT - given copious secretions, increasing WBC and fever, start empiric ceftriaxone for aspiration - CXR in am   Acute anoxic encephalopathy Diffuse cerebral edema Patient's neurological exam and head CT is consistent with anoxic injury.  UDS positive for cocaine.  No known hx of drug abuse.   - repeat CTH today.  He is still triggering MV, otherwise absent reflexes  - at risk for worsening edema/ herniation - EEG as above - with initial imaging with changes, prognosis for meaningful recovery remains poor but will continue to monitor for now  Hypotension due to sedation, resolved  HLD - more hypertensive today, likely response to increasing ICP.  Prn labetalol.  No enteral access at this time. - Hold home amlodipine, statin, benazepril for now.   Poorly controlled diabetes with hyperglycemia Transitioning off insulin gtt to SSI/ levemir   Hyponatremia/hyperkalemia Acute kidney injury Lactic acidosis Hypomag Acute kidney injury from ischemic ATN likely due to cardiac arrest - oliguric, UOP poor.  sCr 2.42 > 2.3 > 2.42 - continue foley  - monitor renal indices closely  -  Mag 4 gm now  Elevated LFTs - likely due to shock liver - repeat in AM, minimize tylenol   COVID positive without respiratory symptoms: was asymptomatic. Diagnosed initial positive test 12/16 - Supportive care - continue contact/ airborne precautions   Best Practice (right click and "Reselect all SmartList Selections" daily)   Diet/type: NPO DVT prophylaxis: systemic heparin GI prophylaxis: PPI Lines: Central line- femoral  Foley:  Yes, and it is still needed Code Status: DNR Last date of multidisciplinary goals of care discussion [12/21: Patient's wife and brother were updated at bedside, decision was to keep him DNR and continue full scope of care for now, including pressors.    12/22 Wife updated.  She states patient told her if anything like this every happened, to give him one week, however she says she doesn't think she will let him suffer if no chance for meaningful recovery.  Would like several more days prior to making a decision, but acknowledges its not hopeful.    Labs   CBC: Recent Labs  Lab 03/08/2021 0309 02/14/2021 0313 02/17/2021 0341 April 06, 2021 0353  WBC 16.5*  --   --  22.8*  HGB 12.1* 12.2* 11.2* 13.7  HCT 37.4* 36.0* 33.0* 41.1  MCV 91.7  --   --  86.5  PLT 317  --   --  250    Basic Metabolic Panel: Recent Labs  Lab 03/09/2021 0309 03/02/2021 0313 02/25/2021 0341 09-Mar-2021 0353  NA 133* 134* 136 136  K 5.3* 5.3* 4.4 4.0  CL 97* 98  --  101  CO2 20*  --   --  21*  GLUCOSE 434* 433*  --  167*  BUN 19 21  --  42*  CREATININE 2.42* 2.30*  --  2.42*  CALCIUM 8.0*  --   --  7.8*  MG  --   --   --  1.6*  PHOS  --   --   --  3.6   GFR: Estimated Creatinine Clearance: 35.5 mL/min (A) (by C-G formula based on SCr of 2.42 mg/dL (H)). Recent Labs  Lab 03/11/2021 0309 02/21/2021 0404 2021-03-09 0353  WBC 16.5*  --  22.8*  LATICACIDVEN  --  4.5*  --     Liver Function Tests: Recent Labs  Lab 02/27/2021 0309  AST 206*  ALT 126*  ALKPHOS 176*  BILITOT 0.7   PROT 6.3*  ALBUMIN 3.0*   No results for input(s): LIPASE, AMYLASE in the last 168 hours. No results for input(s): AMMONIA in the last 168 hours.  ABG    Component Value Date/Time   PHART 7.243 (L) 02/28/2021 0341   PCO2ART 49.3 (H) 03/12/2021 0341   PO2ART 111 (H) 02/19/2021 0341   HCO3 21.6 03/11/2021 0341   TCO2 23 03/13/2021 0341   ACIDBASEDEF 6.0 (H) 02/15/2021 0341   O2SAT 98.0 03/14/2021 0341     Coagulation Profile: Recent Labs  Lab 02/25/2021 0309  INR 1.1    Cardiac Enzymes: No results for input(s): CKTOTAL, CKMB, CKMBINDEX, TROPONINI in the last 168 hours.  HbA1C: Hgb A1c MFr Bld  Date/Time Value Ref Range Status  02/18/2021 03:10 AM 10.3 (H) 4.8 - 5.6 % Final    Comment:    (NOTE) Pre diabetes:          5.7%-6.4%  Diabetes:              >6.4%  Glycemic control for   <7.0% adults with diabetes   06/19/2020 02:38 PM 9.6 (H) 4.8 - 5.6 % Final    Comment:    (NOTE) Pre diabetes:          5.7%-6.4%  Diabetes:              >6.4%  Glycemic control for   <7.0% adults with diabetes     CBG: Recent Labs  Lab 2021-03-09 0240 03/09/21 0339 03/09/21 0448 Mar 09, 2021 0531 03-09-21 0745  GLUCAP 124* 158* 163* 169* 144*    Critical care time:  45 mins     Kennieth Rad, ACNP [facility] 09-Mar-2021, 11:31 AM  See Amion for pager If no response to pager, please call PCCM consult pager After 7:00 pm call Elink

## 2021-03-17 DEATH — deceased
